# Patient Record
Sex: Female | Born: 1950 | Race: White | Hispanic: No | Smoking: Former smoker
Health system: Southern US, Community
[De-identification: ages and names within clinical notes are randomized; demographics above are authoritative.]

## PROBLEM LIST (undated history)

## (undated) DIAGNOSIS — B9620 Unspecified Escherichia coli [E. coli] as the cause of diseases classified elsewhere: Secondary | ICD-10-CM

## (undated) DIAGNOSIS — R2681 Unsteadiness on feet: Secondary | ICD-10-CM

## (undated) DIAGNOSIS — E785 Hyperlipidemia, unspecified: Secondary | ICD-10-CM

## (undated) DIAGNOSIS — F0390 Unspecified dementia without behavioral disturbance: Secondary | ICD-10-CM

## (undated) DIAGNOSIS — N179 Acute kidney failure, unspecified: Secondary | ICD-10-CM

## (undated) DIAGNOSIS — M6281 Muscle weakness (generalized): Secondary | ICD-10-CM

## (undated) DIAGNOSIS — N390 Urinary tract infection, site not specified: Secondary | ICD-10-CM

## (undated) DIAGNOSIS — I69891 Dysphagia following other cerebrovascular disease: Secondary | ICD-10-CM

## (undated) DIAGNOSIS — I10 Essential (primary) hypertension: Secondary | ICD-10-CM

## (undated) DIAGNOSIS — R1312 Dysphagia, oropharyngeal phase: Secondary | ICD-10-CM

## (undated) DIAGNOSIS — F419 Anxiety disorder, unspecified: Secondary | ICD-10-CM

## (undated) HISTORY — DX: Unspecified dementia, unspecified severity, without behavioral disturbance, psychotic disturbance, mood disturbance, and anxiety: F03.90

## (undated) HISTORY — PX: ABDOMINAL HYSTERECTOMY: SHX81

## (undated) HISTORY — DX: Hyperlipidemia, unspecified: E78.5

## (undated) HISTORY — DX: Essential (primary) hypertension: I10

## (undated) HISTORY — DX: Anxiety disorder, unspecified: F41.9

---

## 2020-11-23 ENCOUNTER — Encounter: Payer: Self-pay | Admitting: Family

## 2020-11-23 ENCOUNTER — Ambulatory Visit (INDEPENDENT_AMBULATORY_CARE_PROVIDER_SITE_OTHER): Payer: PPO | Admitting: Family

## 2020-11-23 ENCOUNTER — Other Ambulatory Visit: Payer: Self-pay

## 2020-11-23 VITALS — BP 118/64 | HR 48 | Temp 96.4°F | Resp 20 | Ht 64.0 in | Wt 107.2 lb

## 2020-11-23 DIAGNOSIS — R636 Underweight: Secondary | ICD-10-CM | POA: Diagnosis not present

## 2020-11-23 DIAGNOSIS — Z87891 Personal history of nicotine dependence: Secondary | ICD-10-CM | POA: Diagnosis not present

## 2020-11-23 DIAGNOSIS — F0281 Dementia in other diseases classified elsewhere with behavioral disturbance: Secondary | ICD-10-CM

## 2020-11-23 DIAGNOSIS — G4701 Insomnia due to medical condition: Secondary | ICD-10-CM

## 2020-11-23 DIAGNOSIS — I10 Essential (primary) hypertension: Secondary | ICD-10-CM | POA: Diagnosis not present

## 2020-11-23 DIAGNOSIS — Z0000 Encounter for general adult medical examination without abnormal findings: Secondary | ICD-10-CM

## 2020-11-23 DIAGNOSIS — R600 Localized edema: Secondary | ICD-10-CM

## 2020-11-23 DIAGNOSIS — R197 Diarrhea, unspecified: Secondary | ICD-10-CM | POA: Diagnosis not present

## 2020-11-23 DIAGNOSIS — Z681 Body mass index (BMI) 19 or less, adult: Secondary | ICD-10-CM

## 2020-11-23 DIAGNOSIS — E782 Mixed hyperlipidemia: Secondary | ICD-10-CM

## 2020-11-23 DIAGNOSIS — F411 Generalized anxiety disorder: Secondary | ICD-10-CM | POA: Diagnosis not present

## 2020-11-23 DIAGNOSIS — G300 Alzheimer's disease with early onset: Secondary | ICD-10-CM

## 2020-11-23 DIAGNOSIS — F02818 Dementia in other diseases classified elsewhere, unspecified severity, with other behavioral disturbance: Secondary | ICD-10-CM

## 2020-11-23 MED ORDER — TRAZODONE HCL 100 MG PO TABS: 100.0000 mg | ORAL_TABLET | Freq: Every day | ORAL | 3 refills | Status: AC

## 2020-11-23 MED ORDER — FUROSEMIDE 40 MG PO TABS: 40.0000 mg | ORAL_TABLET | Freq: Every day | ORAL | 3 refills | Status: AC

## 2020-11-23 MED ORDER — DONEPEZIL HCL 5 MG PO TABS: 5.0000 mg | ORAL_TABLET | Freq: Every day | ORAL | 3 refills | Status: DC

## 2020-11-23 MED ORDER — POTASSIUM CHLORIDE CRYS ER 20 MEQ PO TBCR: 20.0000 meq | EXTENDED_RELEASE_TABLET | Freq: Every day | ORAL | 3 refills | Status: DC

## 2020-11-23 MED ORDER — RISPERIDONE 1 MG PO TBDP
1.0000 mg | ORAL_TABLET | Freq: Two times a day (BID) | ORAL | 3 refills | Status: DC
Start: 2020-11-23 — End: 2021-01-01

## 2020-11-23 MED ORDER — DIVALPROEX SODIUM 250 MG PO DR TAB: 250.0000 mg | DELAYED_RELEASE_TABLET | Freq: Two times a day (BID) | ORAL | 3 refills | Status: AC

## 2020-11-23 MED ORDER — TRAZODONE HCL 50 MG PO TABS: 25.0000 mg | ORAL_TABLET | Freq: Two times a day (BID) | ORAL | 3 refills | Status: DC

## 2020-11-23 MED ORDER — ATENOLOL 25 MG PO TABS: 25.0000 mg | ORAL_TABLET | Freq: Every day | ORAL | 3 refills | Status: AC

## 2020-11-23 MED ORDER — BUSPIRONE HCL 15 MG PO TABS: 15.0000 mg | ORAL_TABLET | Freq: Two times a day (BID) | ORAL | 3 refills | Status: AC

## 2020-11-23 MED ORDER — DIVALPROEX SODIUM 125 MG PO DR TAB: 125.0000 mg | DELAYED_RELEASE_TABLET | Freq: Two times a day (BID) | ORAL | 0 refills | Status: DC

## 2020-11-23 MED ORDER — SIMVASTATIN 40 MG PO TABS: 40.0000 mg | ORAL_TABLET | Freq: Every day | ORAL | 3 refills | Status: DC

## 2020-11-23 MED ORDER — AMLODIPINE BESYLATE 5 MG PO TABS: 5.0000 mg | ORAL_TABLET | Freq: Every day | ORAL | 3 refills | Status: AC

## 2020-11-23 NOTE — Patient Instructions (Addendum)
- please collect stool specimen and return to office   - Decrease Aricept from 10 mg to 5 mg tablet daily x 1 week then discontinue due to diarrhea.  - Labs done today will call you with results    Dementia Dementia is a condition that affects the way the brain functions. It often affects memory and thinking. Usually, dementia gets worse with time and cannot be reversed (progressive dementia). There are many types of dementia, including: Alzheimer's disease. This type is the most common. Vascular dementia. This type may happen as the result of a stroke. Lewy body dementia. This type may happen to people who have Parkinson's disease. Frontotemporal dementia. This type is caused by damage to nerve cells (neurons) in certain parts of the brain. Some people may be affected by more than one type of dementia. This is calledmixed dementia. What are the causes? Dementia is caused by damage to cells in the brain. The area of the brain and the types of cells damaged determine the type of dementia. Usually, this damage is irreversible or cannot be undone. Some examples of irreversible causes include: Conditions that affect the blood vessels of the brain, such as diabetes, heart disease, or blood vessel disease. Genetic mutations. In some cases, changes in the brain may be caused by another condition and can be reversed or slowed. Some examples of reversible causes include: Injury to the brain. Certain medicines. Infection, such as meningitis. Metabolic problems, such as vitamin B12 deficiency or thyroid disease. Pressure on the brain, such as from a tumor, blood clot, or too much fluid in the brain (hydrocephalus). Autoimmune diseases that affect the brain or arteries, such as limbic encephalitis or vasculitis. What are the signs or symptoms? Symptoms of dementia depend on the type of dementia. Common signs of dementia include problems with remembering, thinking, problem solving, decision making, and  communicating. These signs develop slowly or get worse with time. This may include: Problems remembering events or people. Having trouble taking a bath or putting clothes on. Forgetting appointments or forgetting to pay bills. Difficulty planning and preparing meals. Having trouble speaking. Getting lost easily. Changes in behavior or mood. How is this diagnosed? This condition is diagnosed by a specialist (neurologist). It is diagnosed based on the history of your symptoms, your medical history, a physical exam, and tests. Tests may include: Tests to evaluate brain function, such as memory tests, cognitive tests, and other tests. Lab tests, such as blood or urine tests. Imaging tests, such as a CT scan, a PET scan, or an MRI. Genetic testing. This may be done if other family members have a diagnosis of certain types of dementia. Your health care provider will talk with you and your family, friends, orcaregivers about your history and symptoms. How is this treated? Treatment for this condition depends on the cause of the dementia. Progressive dementias, such as Alzheimer's disease, cannot be cured, but there may betreatments that help to manage symptoms. Treatment might involve taking medicines that may help to: Control the dementia. Slow down the progression of the dementia. Manage symptoms. In some cases, treating the cause of your dementia can improve symptoms,reverse symptoms, or slow down how quickly your dementia becomes worse. Your health care provider can direct you to support groups, organizations, andother health care providers who can help with decisions about your care. Follow these instructions at home: Medicines Take over-the-counter and prescription medicines only as told by your health care provider. Use a pill organizer or pill reminder to  help you manage your medicines. Avoid taking medicines that can affect thinking, such as pain medicines or sleeping  medicines. Lifestyle Make healthy lifestyle choices. Be physically active as told by your health care provider. Do not use any products that contain nicotine or tobacco, such as cigarettes, e-cigarettes, and chewing tobacco. If you need help quitting, ask your health care provider. Do not drink alcohol. Practice stress-management techniques when you get stressed. Spend time with other people. Make sure to get quality sleep. These tips can help you get a good night's rest: Avoid napping during the day. Keep your sleeping area dark and cool. Avoid exercising during the few hours before you go to bed. Avoid caffeine products in the evening. Eating and drinking Drink enough fluid to keep your urine pale yellow. Eat a healthy diet. General instructions  Work with your health care provider to determine what you need help with and what your safety needs are. Talk with your health care provider about whether it is safe for you to drive. If you were given a bracelet that identifies you as a person with memory loss or tracks your location, make sure to wear it at all times. Work with your family to make important decisions, such as advance directives, medical power of attorney, or a living will. Keep all follow-up visits. This is important.  Where to find more information Alzheimer's Association: LimitLaws.hu [employer] on Aging: CashCowGambling.be World Health Organization: https://castaneda-walker.com/ Contact a health care provider if: You have any new or worsening symptoms. You have problems with choking or swallowing. Get help right away if: You feel depressed or sad, or feel that you want to harm yourself. Your family members become concerned for your safety. If you ever feel like you may hurt yourself or others, or have thoughts about taking your own life, get help right away. Go to your nearest emergency department or: Call your local emergency services (911 in the [place] Call a  suicide crisis helpline, such as the National Suicide Prevention Lifeline at (231)396-9723. This is open 24 hours a day in the [place] Text the Crisis Text Line at 364 605 2391 (in the [place] Summary Dementia is a condition that affects the way the brain functions. Dementia often affects memory and thinking. Usually, dementia gets worse with time and cannot be reversed (progressive dementia). Treatment for this condition depends on the cause of the dementia. Work with your health care provider to determine what you need help with and what your safety needs are. Your health care provider can direct you to support groups, organizations, and other health care providers who can help with decisions about your care. This information is not intended to replace advice given to you by your health care provider. Make sure you discuss any questions you have with your healthcare provider. Document Revised: 09/19/2019 Document Reviewed: 09/19/2019 Elsevier Patient Education  2022 [employer].

## 2020-11-23 NOTE — Progress Notes (Signed)
Provider: Marlowe Sax FNP-C   No primary care provider on file.  No care team member to display  Extended Emergency Contact Information Primary Emergency Contact: [place] Mobile Phone: [phone] Relation: Daughter Secondary Emergency Contact: [place] Mobile Phone: [phone] Relation: Friend  Code Status:  Full Code  Goals of care: Advanced Directive information Advanced Directives 11/23/2020  Does Patient Have a Medical Advance Directive? Yes  Type of Paramedic of [place];Living will  Does patient want to make changes to medical advance directive? No - Patient declined  Copy of [place] in Chart? No - copy requested     Chief Complaint  Patient presents with   Establish Care    Patient presents to establish care. Patients daughter [name] states patient is having diarrhea daily and a large cognitive decline.     HPI:  Pt is a 70 y.o. female seen today to establish care at [facility] practice for medical management of chronic diseases. She is here with her daughter [name] who provides HPI information.Patient walked in to the office but fell asleep in the exam room not answering any question.Daughter states will be wide awake in the afternoon wondering around.She has a medical history of Hypertension,Hyperlipidemia,Dementia with behavioral disturbance,Generalized anxiety Insomnia, Colitis among others.  Recently relocated from [place] where she lived with her Boyfriend.She wondered at night and was found away from her house by the police.She was placed in an [place] due to declined in condition required assistance with Her ADL's.She was wondered around and required higher level of care so was " kicked out of the facility " due to aggressiveness per daughter's report.  Since relocating  in [place] to [place] she has been living with her only daughter at home.Daughter states has an Architectural technologist at home  who helps with patient's ADL's while she goes to work but the [employer] also has some physical issues cannot take care of her longer.she does attend [facility] during the day.   Daughter states has been having diarrhea daily.No nausea or vomiting.does not really verbalize needs.so not clear if she has any abdominal pain. Whenever she speaks just mumbles words that cannot be understood. Daughter wonders if placing in a memory care is a good idea whether she qualifies for memory care.daughter is tearful talking about placement states not sure whether she is making the right decision for the mom.       Past Medical History:  Diagnosis Date   Anxiety    Dementia ([place])    Hyperlipidemia    Hypertension    Past Surgical History:  Procedure Laterality Date   ABDOMINAL HYSTERECTOMY      No Known Allergies  Allergies as of 11/23/2020   No Known Allergies      Medication List        Accurate as of November 23, 2020  8:44 AM. If you have any questions, ask your nurse or doctor.          ABC Complete Senior Womens 50+ Tabs Take 1 tablet by mouth daily.   amLODipine 5 MG tablet Commonly known as: NORVASC Take 5 mg by mouth daily.   atenolol 25 MG tablet Commonly known as: TENORMIN Take 25 mg by mouth at bedtime.   busPIRone 15 MG tablet Commonly known as: BUSPAR Take 15 mg by mouth 2 (two) times daily.   divalproex 250 MG DR tablet Commonly known as: DEPAKOTE Take 250 mg by mouth 2 (two) times  daily.   divalproex 125 MG DR tablet Commonly known as: DEPAKOTE Take 125 mg by mouth 2 (two) times daily.   donepezil 10 MG tablet Commonly known as: ARICEPT Take 10 mg by mouth at bedtime.   furosemide 40 MG tablet Commonly known as: LASIX Take 40 mg by mouth daily.   potassium chloride SA 20 MEQ tablet Commonly known as: KLOR-CON Take 20 mEq by mouth daily.   risperiDONE 1 MG disintegrating tablet Commonly known as: RISPERDAL M-TABS Take 1 mg by mouth  2 (two) times daily.   simvastatin 40 MG tablet Commonly known as: ZOCOR Take 40 mg by mouth daily.   traZODone 50 MG tablet Commonly known as: DESYREL Take 25 mg by mouth 2 (two) times daily.   traZODone 100 MG tablet Commonly known as: DESYREL Take 100 mg by mouth at bedtime.        Review of Systems  Unable to perform ROS: Dementia (HPI provided by patient's Daughter)  Constitutional:  Negative for appetite change, chills, fatigue, fever and unexpected weight change.  HENT:  Negative for congestion, dental problem, ear discharge, ear pain, facial swelling, hearing loss, nosebleeds, postnasal drip, rhinorrhea, sinus pressure, sinus pain, sneezing, sore throat, tinnitus and trouble swallowing.   Eyes:  Negative for pain, discharge, redness, itching and visual disturbance.  Respiratory:  Negative for cough, chest tightness, shortness of breath and wheezing.   Cardiovascular:  Negative for chest pain, palpitations and leg swelling.  Gastrointestinal:  Negative for abdominal distention, abdominal pain, blood in stool, constipation, diarrhea, nausea and vomiting.  Endocrine: Negative for cold intolerance, heat intolerance, polydipsia, polyphagia and polyuria.  Genitourinary:  Negative for difficulty urinating, dysuria, flank pain, frequency and urgency.  Musculoskeletal:  Negative for arthralgias, back pain, gait problem, joint swelling, myalgias, neck pain and neck stiffness.  Skin:  Negative for color change, pallor, rash and wound.  Neurological:  Negative for dizziness, syncope, speech difficulty, weakness, light-headedness, numbness and headaches.  Hematological:  Does not bruise/bleed easily.  Psychiatric/Behavioral:  Positive for confusion. Negative for hallucinations and sleep disturbance. The patient is not nervous/anxious.        Reports wondering worst in the afternoon    Immunization History  Administered Date(s) Administered   Moderna Sars-Covid-2 Vaccination  12/23/2019, 01/20/2020, 09/28/2020   Pertinent  Health Maintenance Due  Topic Date Due   COLONOSCOPY (Pts 45-34yrs Insurance coverage will need to be confirmed)  Never done   MAMMOGRAM  Never done   DEXA SCAN  Never done   PNA vac Low Risk Adult (1 of 2 - PCV13) Never done   INFLUENZA VACCINE  12/17/2020   Fall Risk  11/23/2020  Falls in the past year? 1  Number falls in past yr: 1  Injury with Fall? 0  Risk for fall due to : History of fall(s)  Follow up Falls evaluation completed   Functional Status Survey:    Vitals:   11/23/20 0839  BP: 118/64  Pulse: (!) 48  Resp: 20  Temp: (!) 96.4 F (35.8 C)  SpO2: 93%  Weight: 107 lb 3.2 oz (48.6 kg)  Height: $Remove'5\' 4"'tBVlrEO$  (1.626 m)   Body mass index is 18.4 kg/m. Physical Exam Vitals reviewed.  Constitutional:      General: She is sleeping. She is not in acute distress.    Appearance: Normal appearance. She is underweight. She is not ill-appearing or diaphoretic.  HENT:     Head: Normocephalic.     Right Ear: Tympanic membrane, ear canal and  external ear normal. There is no impacted cerumen.     Left Ear: Tympanic membrane, ear canal and external ear normal. There is no impacted cerumen.     Nose: Nose normal. No congestion or rhinorrhea.     Mouth/Throat:     Comments: Unable to assess had mouth closed  Eyes:     General: No scleral icterus.       Right eye: No discharge.        Left eye: No discharge.     Conjunctiva/sclera: Conjunctivae normal.     Pupils: Pupils are equal, round, and reactive to light.  Cardiovascular:     Rate and Rhythm: Normal rate and regular rhythm.     Pulses: Normal pulses.     Heart sounds: Normal heart sounds. No murmur heard.   No friction rub. No gallop.  Pulmonary:     Effort: Pulmonary effort is normal. No respiratory distress.     Breath sounds: Normal breath sounds. No wheezing, rhonchi or rales.  Chest:     Chest wall: No tenderness.  Abdominal:     General: Bowel sounds are increased.  There is no distension.     Palpations: Abdomen is soft. There is no mass.     Tenderness: There is no abdominal tenderness. There is no right CVA tenderness, left CVA tenderness, guarding or rebound.     Comments: Bowel sounds hyperactive x 4 Quadrant   Musculoskeletal:        General: No swelling or tenderness. Normal range of motion.     Cervical back: Normal range of motion. No rigidity or tenderness.     Right lower leg: No edema.     Left lower leg: No edema.  Lymphadenopathy:     Cervical: No cervical adenopathy.  Skin:    General: Skin is warm and dry.     Coloration: Skin is not pale.     Findings: No bruising, erythema, lesion or rash.  Neurological:     Cranial Nerves: No cranial nerve deficit.     Sensory: No sensory deficit.     Motor: No weakness.     Coordination: Coordination normal.     Gait: Gait normal.     Comments: Unable to complete MMSE due to sleeping throughout the visit  Psychiatric:        Mood and Affect: Mood normal.        Behavior: Behavior is uncooperative.        Cognition and Memory: Cognition is impaired. Memory is impaired.     Comments: Non-verbal and sleeping throughout the visit Unable to complete MMSE      Labs reviewed: No results for input(s): NA, K, CL, CO2, GLUCOSE, BUN, CREATININE, CALCIUM, MG, PHOS in the last 8760 hours. No results for input(s): AST, ALT, ALKPHOS, BILITOT, PROT, ALBUMIN in the last 8760 hours. No results for input(s): WBC, NEUTROABS, HGB, HCT, MCV, PLT in the last 8760 hours. No results found for: TSH No results found for: HGBA1C No results found for: CHOL, HDL, LDLCALC, LDLDIRECT, TRIG, CHOLHDL  Significant Diagnostic Results in last 30 days:  No results found.  Assessment/Plan 1. Essential hypertension B/p well controlled. - continue on Amlodipine 5 mg daily, furosemide 40 mg tablet daily and atenolol 25 mg tablet daily at bedtime.  - CBC with Differential/Platelet - CMP with eGFR([facility]) - TSH -  amLODipine (NORVASC) 5 MG tablet; Take 1 tablet (5 mg total) by mouth daily.  Dispense: 30 tablet; Refill: 3 - atenolol (TENORMIN)  25 MG tablet; Take 1 tablet (25 mg total) by mouth at bedtime.  Dispense: 30 tablet; Refill: 3  2. Mixed hyperlipidemia No labs for review. Continue on simvastatin 40 mg tablet daily  - Lipid panel - simvastatin (ZOCOR) 40 MG tablet; Take 1 tablet (40 mg total) by mouth daily.  Dispense: 30 tablet; Refill: 3  3. Encounter for medical examination to establish care Relocated from [place].No medical records for review.will need to obtain medical records then update immunization and required screening. Has had 3rd COVID-19 vaccine due for 4 th booster daughter will arrange take her to pharmacy.   4. 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 Resides with her daughter and Aunt who assist with her ADL's. Transfer to higher level of Care in Memory Care Unit recommended due to wandering and increased need of care.daughter will update provider then write [place] 2 once she identifies a memory Care Unit they desire.  - polypharmacy noted though daughter requested not to change Divalproex ,Risperidone and trazodone dosage since it's working well for patient's behaviors and aggressiveness.  Recommended weaning medication to lower dosage.  Advised to reduce Aricept to 5 mg tablet daily x 1 week then discontinue due to weight loss and diarrhea.Aricept probably not beneficial for her at this stage of dementia.  - Vitamin B12 - donepezil (ARICEPT) 5 MG tablet; Take 1 tablet (5 mg total) by mouth at bedtime.  Dispense: 30 tablet; Refill: 3 - divalproex (DEPAKOTE) 125 MG DR tablet; Take 1 tablet (125 mg total) by mouth 2 (two) times daily.  Dispense: 30 tablet; Refill: 0 - divalproex (DEPAKOTE) 250 MG DR tablet;  Take 1 tablet (250 mg total) by mouth 2 (two) times daily.  Dispense: 30 tablet; Refill: 3 - risperiDONE (RISPERDAL M-TABS) 1 MG disintegrating tablet; Take 1 tablet (1 mg total) by mouth 2 (two) times daily.  Dispense: 30 tablet; Refill: 3 - additional education information provided on AVS and Dementia Booklet copy also given.  5. Insomnia due to medical condition Continue on trazodone.recommended dose reduction but daughter declined patient sleeping well on current dose.  - traZODone (DESYREL) 100 MG tablet; Take 1 tablet (100 mg total) by mouth at bedtime.  Dispense: 30 tablet; Refill: 3 - traZODone (DESYREL) 50 MG tablet; Take 0.5 tablets (25 mg total) by mouth 2 (two) times daily.  Dispense: 30 tablet; Refill: 3  6. Generalized anxiety disorder Continue on Buspar  - busPIRone (BUSPAR) 15 MG tablet; Take 1 tablet (15 mg total) by mouth 2 (two) times daily.  Dispense: 30 tablet; Refill: 3  7. Diarrhea, unspecified type Suspect possible due to SE from Aricept.will discontinue as above Obtain stool specimen at home then bring to office lab to rule out ova and Cyst and C-diff.  - Aerobic Culture; Future  8. Bilateral edema of lower extremity Continue on Furosemide.No edema noted.will obtain medical records to evaluate for hx of CHF.Reduce dose to 20 mg tablet next visit if no edema or signs of fluid overload.  - furosemide (LASIX) 40 MG tablet; Take 1 tablet (40 mg total) by mouth daily.  Dispense: 30 tablet; Refill: 3 - potassium chloride SA (KLOR-CON) 20 MEQ tablet; Take 1 tablet (20 mEq total) by mouth daily.  Dispense: 30 tablet; Refill: 3  9. Former smoker Smoked for many years per daughter.   Family/ staff Communication: Reviewed plan of care with  patient and daughter.Patient slept throughout the visit alert but nonverbal.   Labs/tests ordered:  - CBC with Differential/Platelet - CMP with eGFR([facility]) - TSH - Lipid panel - Vitamin B 12 - Aerobic Culture; Future  Next  Appointment : 5 months for medical management of chronic issues.   Sandrea Hughs, NP

## 2020-11-24 LAB — CBC WITH DIFFERENTIAL/PLATELET
Absolute Monocytes: 780 cells/uL (ref 200–950)
Basophils Absolute: 18 cells/uL (ref 0–200)
Basophils Relative: 0.3 %
Eosinophils Absolute: 30 cells/uL (ref 15–500)
Eosinophils Relative: 0.5 %
HCT: 29.9 % — ABNORMAL LOW (ref 35.0–45.0)
Hemoglobin: 10.3 g/dL — ABNORMAL LOW (ref 11.7–15.5)
Lymphs Abs: 1176 cells/uL (ref 850–3900)
MCH: 33.4 pg — ABNORMAL HIGH (ref 27.0–33.0)
MCHC: 34.4 g/dL (ref 32.0–36.0)
MCV: 97.1 fL (ref 80.0–100.0)
MPV: 11.1 fL (ref 7.5–12.5)
Monocytes Relative: 13 %
Neutro Abs: 3996 cells/uL (ref 1500–7800)
Neutrophils Relative %: 66.6 %
Platelets: 178 10*3/uL (ref 140–400)
RBC: 3.08 10*6/uL — ABNORMAL LOW (ref 3.80–5.10)
RDW: 12.9 % (ref 11.0–15.0)
Total Lymphocyte: 19.6 %
WBC: 6 10*3/uL (ref 3.8–10.8)

## 2020-11-24 LAB — COMPLETE METABOLIC PANEL WITH GFR
AG Ratio: 1.7 (calc) (ref 1.0–2.5)
ALT: 11 U/L (ref 6–29)
AST: 16 U/L (ref 10–35)
Albumin: 3.9 g/dL (ref 3.6–5.1)
Alkaline phosphatase (APISO): 38 U/L (ref 37–153)
BUN: 22 mg/dL (ref 7–25)
CO2: 30 mmol/L (ref 20–32)
Calcium: 9 mg/dL (ref 8.6–10.4)
Chloride: 102 mmol/L (ref 98–110)
Creat: 0.83 mg/dL (ref 0.60–0.93)
GFR, Est African American: 83 mL/min/{1.73_m2} (ref >=60)
GFR, Est Non African American: 71 mL/min/{1.73_m2} (ref >=60)
Globulin: 2.3 g/dL (calc) (ref 1.9–3.7)
Glucose, Bld: 78 mg/dL (ref 65–99)
Potassium: 3.8 mmol/L (ref 3.5–5.3)
Sodium: 142 mmol/L (ref 135–146)
Total Bilirubin: 0.5 mg/dL (ref 0.2–1.2)
Total Protein: 6.2 g/dL (ref 6.1–8.1)

## 2020-11-24 LAB — LIPID PANEL
Cholesterol: 158 mg/dL (ref <200)
HDL: 56 mg/dL (ref >=50)
LDL Cholesterol (Calc): 83 mg/dL (calc)
Non-HDL Cholesterol (Calc): 102 mg/dL (calc) (ref <130)
Total CHOL/HDL Ratio: 2.8 (calc) (ref <5.0)
Triglycerides: 97 mg/dL (ref <150)

## 2020-11-24 LAB — VITAMIN B12: Vitamin B-12: 589 pg/mL (ref 200–1100)

## 2020-11-24 LAB — TSH: TSH: 2.24 mIU/L (ref 0.40–4.50)

## 2020-11-25 DIAGNOSIS — E782 Mixed hyperlipidemia: Secondary | ICD-10-CM | POA: Insufficient documentation

## 2020-11-25 DIAGNOSIS — I10 Essential (primary) hypertension: Secondary | ICD-10-CM | POA: Insufficient documentation

## 2020-11-25 DIAGNOSIS — R636 Underweight: Secondary | ICD-10-CM | POA: Insufficient documentation

## 2020-11-25 DIAGNOSIS — Z681 Body mass index (BMI) 19 or less, adult: Secondary | ICD-10-CM | POA: Insufficient documentation

## 2020-11-25 DIAGNOSIS — Z87891 Personal history of nicotine dependence: Secondary | ICD-10-CM | POA: Insufficient documentation

## 2020-11-25 DIAGNOSIS — G4701 Insomnia due to medical condition: Secondary | ICD-10-CM | POA: Insufficient documentation

## 2020-11-25 DIAGNOSIS — G300 Alzheimer's disease with early onset: Secondary | ICD-10-CM | POA: Insufficient documentation

## 2020-11-25 DIAGNOSIS — R600 Localized edema: Secondary | ICD-10-CM | POA: Insufficient documentation

## 2020-11-25 DIAGNOSIS — F411 Generalized anxiety disorder: Secondary | ICD-10-CM | POA: Insufficient documentation

## 2020-11-27 ENCOUNTER — Other Ambulatory Visit: Payer: Self-pay

## 2020-11-27 DIAGNOSIS — R197 Diarrhea, unspecified: Secondary | ICD-10-CM

## 2020-12-03 DIAGNOSIS — R197 Diarrhea, unspecified: Secondary | ICD-10-CM | POA: Diagnosis not present

## 2020-12-05 LAB — SALMONELLA/SHIGELLA CULT, CAMPY EIA AND SHIGA TOXIN RFL ECOLI
MICRO NUMBER: 12132307
MICRO NUMBER:: 12132308
MICRO NUMBER:: 12132309
Result:: NOT DETECTED
SHIGA RESULT:: NOT DETECTED
SPECIMEN QUALITY: ADEQUATE
SPECIMEN QUALITY:: ADEQUATE
SPECIMEN QUALITY:: ADEQUATE

## 2020-12-13 ENCOUNTER — Telehealth: Payer: Self-pay | Admitting: Family

## 2020-12-13 DIAGNOSIS — R197 Diarrhea, unspecified: Secondary | ICD-10-CM

## 2020-12-13 NOTE — Telephone Encounter (Signed)
Pts daughter stopped in to pick up ltr & has decided that moving forward with referral to Gastrology would be a good idea.  Once referral is made, I will send  Thanks, Rosezella [place]

## 2020-12-13 NOTE — Telephone Encounter (Signed)
Gastroenterology referral ordered.  

## 2020-12-14 ENCOUNTER — Other Ambulatory Visit: Payer: Self-pay | Admitting: Family

## 2020-12-14 NOTE — Telephone Encounter (Signed)
Medication requested for patient was for Aricept 10 mg and according to active medication list patient is taking 5 mg daily and is not due for refills at this time

## 2020-12-19 ENCOUNTER — Encounter: Payer: Self-pay | Admitting: Gastroenterology

## 2020-12-19 ENCOUNTER — Other Ambulatory Visit: Payer: Self-pay | Admitting: Family

## 2020-12-19 DIAGNOSIS — G300 Alzheimer's disease with early onset: Secondary | ICD-10-CM

## 2020-12-19 MED ORDER — MEMANTINE HCL ER 7 & 14 & 21 &28 MG PO CP24
ORAL_CAPSULE | ORAL | 3 refills | Status: DC
Start: 2020-12-19 — End: 2020-12-21

## 2020-12-20 ENCOUNTER — Telehealth: Payer: Self-pay | Admitting: *Deleted

## 2020-12-20 NOTE — Telephone Encounter (Signed)
Received a fax from [facility] stating that they cannot get Titration Pack for Namenda XR and needs an alternative.   Please Advise.

## 2020-12-21 MED ORDER — MEMANTINE HCL ER 28 MG PO CP24: 28.0000 mg | ORAL_CAPSULE | Freq: Every day | ORAL | 3 refills | Status: DC

## 2020-12-21 MED ORDER — MEMANTINE HCL ER 28 MG PO CP24: 28.0000 mg | ORAL_CAPSULE | Freq: Every day | ORAL | 3 refills | Status: AC

## 2020-12-21 NOTE — Addendum Note (Signed)
Addended by: Nelda Severe A on: 12/21/2020 03:02 PM   Modules accepted: Orders

## 2020-12-21 NOTE — Telephone Encounter (Signed)
Called and spoke with Chelsea Aguirre at [facility] and she stated that they ONLY have Namenda XR 28mg  capsules  They have no recommendation for alternative.   Please Advise.

## 2020-12-21 NOTE — Telephone Encounter (Signed)
Medication list updated and Rx sent to pharmacy  

## 2020-12-21 NOTE — Telephone Encounter (Signed)
Discontinue Namenda titration pack  Start on Namenda XR 28 mg capsule one by mouth daily.

## 2020-12-21 NOTE — Telephone Encounter (Signed)
Please inquire which Alternative that pharmacy carries then replace.

## 2020-12-27 ENCOUNTER — Telehealth: Payer: Self-pay | Admitting: *Deleted

## 2020-12-27 ENCOUNTER — Inpatient Hospital Stay (HOSPITAL_COMMUNITY)
Admission: EM | Admit: 2020-12-27 | Discharge: 2021-01-01 | DRG: 689 | Disposition: A | Discharge status: Skilled Nursing Facility | Payer: PPO | Attending: Internal Medicine | Admitting: Internal Medicine

## 2020-12-27 ENCOUNTER — Other Ambulatory Visit: Payer: Self-pay

## 2020-12-27 ENCOUNTER — Encounter (HOSPITAL_COMMUNITY): Payer: Self-pay | Admitting: Internal Medicine

## 2020-12-27 ENCOUNTER — Emergency Department (HOSPITAL_COMMUNITY): Payer: PPO

## 2020-12-27 DIAGNOSIS — E782 Mixed hyperlipidemia: Secondary | ICD-10-CM | POA: Diagnosis not present

## 2020-12-27 DIAGNOSIS — D649 Anemia, unspecified: Secondary | ICD-10-CM | POA: Diagnosis not present

## 2020-12-27 DIAGNOSIS — F32A Depression, unspecified: Secondary | ICD-10-CM | POA: Diagnosis present

## 2020-12-27 DIAGNOSIS — E43 Unspecified severe protein-calorie malnutrition: Secondary | ICD-10-CM | POA: Insufficient documentation

## 2020-12-27 DIAGNOSIS — E860 Dehydration: Secondary | ICD-10-CM | POA: Diagnosis present

## 2020-12-27 DIAGNOSIS — Z515 Encounter for palliative care: Secondary | ICD-10-CM | POA: Diagnosis not present

## 2020-12-27 DIAGNOSIS — F419 Anxiety disorder, unspecified: Secondary | ICD-10-CM | POA: Diagnosis not present

## 2020-12-27 DIAGNOSIS — F0281 Dementia in other diseases classified elsewhere with behavioral disturbance: Secondary | ICD-10-CM | POA: Diagnosis present

## 2020-12-27 DIAGNOSIS — Z9183 Wandering in diseases classified elsewhere: Secondary | ICD-10-CM

## 2020-12-27 DIAGNOSIS — R2689 Other abnormalities of gait and mobility: Secondary | ICD-10-CM | POA: Diagnosis not present

## 2020-12-27 DIAGNOSIS — Z87891 Personal history of nicotine dependence: Secondary | ICD-10-CM | POA: Diagnosis not present

## 2020-12-27 DIAGNOSIS — F411 Generalized anxiety disorder: Secondary | ICD-10-CM | POA: Diagnosis not present

## 2020-12-27 DIAGNOSIS — F0390 Unspecified dementia without behavioral disturbance: Secondary | ICD-10-CM | POA: Diagnosis not present

## 2020-12-27 DIAGNOSIS — G300 Alzheimer's disease with early onset: Secondary | ICD-10-CM | POA: Diagnosis present

## 2020-12-27 DIAGNOSIS — Z681 Body mass index (BMI) 19 or less, adult: Secondary | ICD-10-CM | POA: Diagnosis not present

## 2020-12-27 DIAGNOSIS — Z66 Do not resuscitate: Secondary | ICD-10-CM | POA: Diagnosis present

## 2020-12-27 DIAGNOSIS — I69891 Dysphagia following other cerebrovascular disease: Secondary | ICD-10-CM | POA: Diagnosis not present

## 2020-12-27 DIAGNOSIS — R059 Cough, unspecified: Secondary | ICD-10-CM | POA: Diagnosis not present

## 2020-12-27 DIAGNOSIS — Z20822 Contact with and (suspected) exposure to covid-19: Secondary | ICD-10-CM | POA: Diagnosis present

## 2020-12-27 DIAGNOSIS — R1312 Dysphagia, oropharyngeal phase: Secondary | ICD-10-CM | POA: Diagnosis not present

## 2020-12-27 DIAGNOSIS — N390 Urinary tract infection, site not specified: Principal | ICD-10-CM | POA: Diagnosis present

## 2020-12-27 DIAGNOSIS — R29898 Other symptoms and signs involving the musculoskeletal system: Secondary | ICD-10-CM | POA: Diagnosis not present

## 2020-12-27 DIAGNOSIS — I10 Essential (primary) hypertension: Secondary | ICD-10-CM | POA: Diagnosis present

## 2020-12-27 DIAGNOSIS — N3000 Acute cystitis without hematuria: Secondary | ICD-10-CM | POA: Diagnosis not present

## 2020-12-27 DIAGNOSIS — Z7189 Other specified counseling: Secondary | ICD-10-CM | POA: Diagnosis not present

## 2020-12-27 DIAGNOSIS — Z7401 Bed confinement status: Secondary | ICD-10-CM | POA: Diagnosis not present

## 2020-12-27 DIAGNOSIS — N179 Acute kidney failure, unspecified: Secondary | ICD-10-CM | POA: Diagnosis not present

## 2020-12-27 DIAGNOSIS — Z79899 Other long term (current) drug therapy: Secondary | ICD-10-CM

## 2020-12-27 DIAGNOSIS — R531 Weakness: Secondary | ICD-10-CM | POA: Diagnosis not present

## 2020-12-27 DIAGNOSIS — R410 Disorientation, unspecified: Secondary | ICD-10-CM | POA: Diagnosis not present

## 2020-12-27 DIAGNOSIS — Z820 Family history of epilepsy and other diseases of the nervous system: Secondary | ICD-10-CM | POA: Diagnosis not present

## 2020-12-27 DIAGNOSIS — R4182 Altered mental status, unspecified: Secondary | ICD-10-CM | POA: Diagnosis not present

## 2020-12-27 DIAGNOSIS — B9620 Unspecified Escherichia coli [E. coli] as the cause of diseases classified elsewhere: Secondary | ICD-10-CM | POA: Diagnosis not present

## 2020-12-27 DIAGNOSIS — M6281 Muscle weakness (generalized): Secondary | ICD-10-CM | POA: Diagnosis not present

## 2020-12-27 DIAGNOSIS — Z888 Allergy status to other drugs, medicaments and biological substances status: Secondary | ICD-10-CM | POA: Diagnosis not present

## 2020-12-27 DIAGNOSIS — I6782 Cerebral ischemia: Secondary | ICD-10-CM | POA: Diagnosis not present

## 2020-12-27 DIAGNOSIS — R0902 Hypoxemia: Secondary | ICD-10-CM | POA: Diagnosis not present

## 2020-12-27 DIAGNOSIS — I69828 Other speech and language deficits following other cerebrovascular disease: Secondary | ICD-10-CM | POA: Diagnosis not present

## 2020-12-27 DIAGNOSIS — K529 Noninfective gastroenteritis and colitis, unspecified: Secondary | ICD-10-CM | POA: Diagnosis not present

## 2020-12-27 DIAGNOSIS — R2681 Unsteadiness on feet: Secondary | ICD-10-CM | POA: Diagnosis not present

## 2020-12-27 DIAGNOSIS — D539 Nutritional anemia, unspecified: Secondary | ICD-10-CM | POA: Diagnosis present

## 2020-12-27 DIAGNOSIS — R488 Other symbolic dysfunctions: Secondary | ICD-10-CM | POA: Diagnosis not present

## 2020-12-27 DIAGNOSIS — R404 Transient alteration of awareness: Secondary | ICD-10-CM | POA: Diagnosis not present

## 2020-12-27 DIAGNOSIS — R5381 Other malaise: Secondary | ICD-10-CM | POA: Diagnosis not present

## 2020-12-27 DIAGNOSIS — R9431 Abnormal electrocardiogram [ECG] [EKG]: Secondary | ICD-10-CM | POA: Diagnosis not present

## 2020-12-27 LAB — URINALYSIS, ROUTINE W REFLEX MICROSCOPIC
Bilirubin Urine: NEGATIVE
Glucose, UA: NEGATIVE mg/dL
Hgb urine dipstick: NEGATIVE
Ketones, ur: 5 mg/dL — AB
Nitrite: NEGATIVE
Protein, ur: NEGATIVE mg/dL
Specific Gravity, Urine: 1.008 (ref 1.005–1.030)
WBC, UA: >50 WBC/hpf — ABNORMAL HIGH (ref 0–5)
pH: 6 (ref 5.0–8.0)

## 2020-12-27 LAB — LACTIC ACID, PLASMA
Lactic Acid, Venous: 1.1 mmol/L (ref 0.5–1.9)
Lactic Acid, Venous: 2 mmol/L (ref 0.5–1.9)

## 2020-12-27 LAB — CBC
HCT: 36 % (ref 36.0–46.0)
HCT: 32.5 % — ABNORMAL LOW (ref 36.0–46.0)
Hemoglobin: 11.9 g/dL — ABNORMAL LOW (ref 12.0–15.0)
Hemoglobin: 10.5 g/dL — ABNORMAL LOW (ref 12.0–15.0)
MCH: 33.4 pg (ref 26.0–34.0)
MCH: 33.2 pg (ref 26.0–34.0)
MCHC: 33.1 g/dL (ref 30.0–36.0)
MCHC: 32.3 g/dL (ref 30.0–36.0)
MCV: 101.1 fL — ABNORMAL HIGH (ref 80.0–100.0)
MCV: 102.8 fL — ABNORMAL HIGH (ref 80.0–100.0)
Platelets: 186 10*3/uL (ref 150–400)
Platelets: 171 K/uL (ref 150–400)
RBC: 3.56 MIL/uL — ABNORMAL LOW (ref 3.87–5.11)
RBC: 3.16 MIL/uL — ABNORMAL LOW (ref 3.87–5.11)
RDW: 13.4 % (ref 11.5–15.5)
RDW: 13.5 % (ref 11.5–15.5)
WBC: 8 10*3/uL (ref 4.0–10.5)
WBC: 7.3 K/uL (ref 4.0–10.5)
nRBC: 0 % (ref 0.0–0.2)
nRBC: 0 % (ref 0.0–0.2)

## 2020-12-27 LAB — BASIC METABOLIC PANEL
Anion gap: 10 (ref 5–15)
BUN: 23 mg/dL (ref 8–23)
CO2: 31 mmol/L (ref 22–32)
Calcium: 9.1 mg/dL (ref 8.9–10.3)
Chloride: 102 mmol/L (ref 98–111)
Creatinine, Ser: 1.02 mg/dL — ABNORMAL HIGH (ref 0.44–1.00)
GFR, Estimated: 59 mL/min — ABNORMAL LOW (ref >60)
Glucose, Bld: 107 mg/dL — ABNORMAL HIGH (ref 70–99)
Potassium: 4.1 mmol/L (ref 3.5–5.1)
Sodium: 143 mmol/L (ref 135–145)

## 2020-12-27 LAB — HIV ANTIBODY (ROUTINE TESTING W REFLEX): HIV Screen 4th Generation wRfx: NONREACTIVE

## 2020-12-27 LAB — VITAMIN B12: Vitamin B-12: 498 pg/mL (ref 180–914)

## 2020-12-27 LAB — MAGNESIUM: Magnesium: 2.2 mg/dL (ref 1.7–2.4)

## 2020-12-27 LAB — CREATININE, SERUM
Creatinine, Ser: 0.89 mg/dL (ref 0.44–1.00)
GFR, Estimated: >60 mL/min (ref >60)

## 2020-12-27 LAB — CBG MONITORING, ED: Glucose-Capillary: 108 mg/dL — ABNORMAL HIGH (ref 70–99)

## 2020-12-27 LAB — TSH
TSH: 2.301 u[IU]/mL (ref 0.350–4.500)
TSH: 1.983 u[IU]/mL (ref 0.350–4.500)

## 2020-12-27 LAB — PHOSPHORUS: Phosphorus: 4.2 mg/dL (ref 2.5–4.6)

## 2020-12-27 LAB — FOLATE: Folate: 26.9 ng/mL (ref >5.9)

## 2020-12-27 MED ORDER — ONDANSETRON HCL 4 MG/2ML IJ SOLN: 4.0000 mg | Freq: Four times a day (QID) | INTRAMUSCULAR | Status: DC | PRN

## 2020-12-27 MED ORDER — ONDANSETRON HCL 4 MG PO TABS: 4.0000 mg | ORAL_TABLET | Freq: Four times a day (QID) | ORAL | Status: DC | PRN

## 2020-12-27 MED ORDER — SODIUM CHLORIDE 0.9 % IV SOLN
1.0000 g | Freq: Every day | INTRAVENOUS | Status: DC
  Administered 2020-12-28 – 2020-12-30 (×3): 1 g via INTRAVENOUS
  Filled 2020-12-27 (×3): qty 1

## 2020-12-27 MED ORDER — SODIUM CHLORIDE 0.9 % IV SOLN: 1.0000 g | Freq: Once | INTRAVENOUS | Status: DC

## 2020-12-27 MED ORDER — DIVALPROEX SODIUM 125 MG PO DR TAB
125.0000 mg | DELAYED_RELEASE_TABLET | Freq: Two times a day (BID) | ORAL | Status: DC
  Administered 2020-12-28 – 2021-01-01 (×10): 125 mg via ORAL
  Filled 2020-12-27 (×11): qty 1

## 2020-12-27 MED ORDER — SODIUM CHLORIDE 0.9 % IV BOLUS
1000.0000 mL | Freq: Once | INTRAVENOUS | Status: AC
  Administered 2020-12-27: 1000 mL via INTRAVENOUS

## 2020-12-27 MED ORDER — RISPERIDONE 1 MG PO TABS
1.0000 mg | ORAL_TABLET | Freq: Every day | ORAL | Status: DC
  Administered 2020-12-27 – 2021-01-01 (×6): 1 mg via ORAL
  Filled 2020-12-27 (×6): qty 1

## 2020-12-27 MED ORDER — MEMANTINE HCL ER 28 MG PO CP24
28.0000 mg | ORAL_CAPSULE | Freq: Every day | ORAL | Status: DC
  Administered 2020-12-27 – 2021-01-01 (×6): 28 mg via ORAL
  Filled 2020-12-27 (×8): qty 1

## 2020-12-27 MED ORDER — SIMVASTATIN 20 MG PO TABS
40.0000 mg | ORAL_TABLET | Freq: Every day | ORAL | Status: DC
  Administered 2020-12-27 – 2020-12-28 (×2): 40 mg via ORAL
  Filled 2020-12-27 (×2): qty 2

## 2020-12-27 MED ORDER — ENOXAPARIN SODIUM 40 MG/0.4ML IJ SOSY
40.0000 mg | PREFILLED_SYRINGE | INTRAMUSCULAR | Status: DC
  Administered 2020-12-27 – 2020-12-28 (×2): 40 mg via SUBCUTANEOUS
  Filled 2020-12-27 (×2): qty 0.4

## 2020-12-27 MED ORDER — SODIUM CHLORIDE 0.9 % IV SOLN: INTRAVENOUS | Status: DC

## 2020-12-27 MED ORDER — ACETAMINOPHEN 325 MG PO TABS: 650.0000 mg | ORAL_TABLET | Freq: Four times a day (QID) | ORAL | Status: DC | PRN

## 2020-12-27 MED ORDER — TRAZODONE HCL 100 MG PO TABS
100.0000 mg | ORAL_TABLET | Freq: Every day | ORAL | Status: DC
  Administered 2020-12-28 – 2021-01-01 (×5): 100 mg via ORAL
  Filled 2020-12-27 (×5): qty 1

## 2020-12-27 MED ORDER — SODIUM CHLORIDE 0.9 % IV SOLN
1.0000 g | Freq: Once | INTRAVENOUS | Status: AC
  Administered 2020-12-27: 1 g via INTRAVENOUS
  Filled 2020-12-27: qty 10

## 2020-12-27 MED ORDER — BUSPIRONE HCL 5 MG PO TABS
15.0000 mg | ORAL_TABLET | Freq: Two times a day (BID) | ORAL | Status: DC
  Administered 2020-12-28 – 2021-01-01 (×10): 15 mg via ORAL
  Filled 2020-12-27 (×10): qty 3

## 2020-12-27 MED ORDER — DIVALPROEX SODIUM 250 MG PO DR TAB
250.0000 mg | DELAYED_RELEASE_TABLET | Freq: Two times a day (BID) | ORAL | Status: DC
  Administered 2020-12-28 – 2021-01-01 (×10): 250 mg via ORAL
  Filled 2020-12-27 (×10): qty 1

## 2020-12-27 MED ORDER — ACETAMINOPHEN 650 MG RE SUPP: 650.0000 mg | Freq: Four times a day (QID) | RECTAL | Status: DC | PRN

## 2020-12-27 NOTE — Telephone Encounter (Signed)
Patient daughter, Jake Samples, called and stated that patient has been lethargic since yesterday.  Stated that patient has not gotten out of bed at all today and has not ate or drink. Patient not walking because too weak. Cannot get patient up.   Advised daughter to call 911 to have patient evaluated. Agreed.

## 2020-12-27 NOTE — ED Provider Notes (Signed)
[facility]-EMERGENCY DEPT Provider Note   CSN: [id number] Arrival date & time: 12/27/20  1315     History Chief Complaint  Patient presents with   Weakness    Del Wiseman is a 70 y.o. female with PMH dementia, HLD, HTN who presents the emergency department for evaluation of weakness and mental status decompensation.  Daughter states that at baseline, the patient is nonverbal and pleasantly demented, but able to ambulate without difficulty.  The patient currently is at a daytime skilled nursing memory care unit but returns home in the evenings.  Daughter states that over the last 4 days, the patient has had a significant rapid decline in baseline activity and is currently no longer ambulatory.  She states that the patient has gotten significantly more lethargic.  Denies fever, vomiting, diarrhea or other systemic symptoms.  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     Family History  Problem Relation Age of Onset   Alzheimer's disease Mother    Dementia Sister     Social History   Tobacco Use   Smoking status:  Former    Types: Cigarettes   Smokeless tobacco: Former  Building services engineer Use: Never used  Substance Use Topics   Alcohol use: Never   Drug use: Never    Home Medications Prior to Admission medications   Medication Sig Start Date End Date Taking? Authorizing Provider  amLODipine (NORVASC) 5 MG tablet Take 1 tablet (5 mg total) by mouth daily. 11/23/20   Ngetich, Dinah C, NP  atenolol (TENORMIN) 25 MG tablet Take 1 tablet (25 mg total) by mouth at bedtime. 11/23/20   Ngetich, Dinah C, NP  busPIRone (BUSPAR) 15 MG tablet Take 1 tablet (15 mg total) by mouth 2 (two) times daily. 11/23/20   Ngetich, Dinah C, NP  divalproex (DEPAKOTE) 125 MG DR tablet Take 1 tablet (125 mg total) by mouth 2 (two) times daily. 11/23/20   Ngetich, Dinah C, NP  divalproex (DEPAKOTE) 250 MG DR tablet Take 1 tablet (250 mg total) by mouth 2 (two) times daily. 11/23/20   Ngetich, Dinah C, NP  donepezil (ARICEPT) 5 MG tablet Take 1 tablet (5 mg total) by mouth at bedtime. 11/23/20   Ngetich, Dinah C, NP  furosemide (LASIX) 40 MG tablet Take 1 tablet (40 mg total) by mouth daily. 11/23/20   Ngetich, Dinah C, NP  memantine (NAMENDA XR) 28 MG CP24 24 hr capsule Take 1 capsule (28 mg total) by mouth daily. 12/21/20   Ngetich, Donalee Citrin, NP  Multiple Vitamins-Minerals (  ABC COMPLETE SENIOR WOMENS 50+) TABS Take 1 tablet by mouth daily. 10/24/20   [provider]  potassium chloride SA (KLOR-CON) 20 MEQ tablet Take 1 tablet (20 mEq total) by mouth daily. 11/23/20   Ngetich, Dinah C, NP  risperiDONE (RISPERDAL M-TABS) 1 MG disintegrating tablet Take 1 tablet (1 mg total) by mouth 2 (two) times daily. 11/23/20   Ngetich, Dinah C, NP  simvastatin (ZOCOR) 40 MG tablet Take 1 tablet (40 mg total) by mouth daily. 11/23/20   Ngetich, Dinah C, NP  traZODone (DESYREL) 100 MG tablet Take 1 tablet (100 mg total) by mouth at bedtime. 11/23/20   Ngetich, Dinah C, NP  traZODone (DESYREL) 50 MG tablet Take 0.5 tablets (25 mg total) by mouth 2 (two) times  daily. 11/23/20   Ngetich, Donalee Citrin, NP    Allergies    Patient has no known allergies.  Review of Systems   Review of Systems  Constitutional:  Positive for activity change. Negative for chills and fever.  HENT:  Negative for ear pain and sore throat.   Eyes:  Negative for pain and visual disturbance.  Respiratory:  Negative for cough and shortness of breath.   Cardiovascular:  Negative for chest pain and palpitations.  Gastrointestinal:  Negative for abdominal pain and vomiting.  Genitourinary:  Negative for dysuria and hematuria.  Musculoskeletal:  Negative for arthralgias and back pain.  Skin:  Negative for color change and rash.  Neurological:  Positive for weakness. Negative for seizures and syncope.  All other systems reviewed and are negative.  Physical Exam Updated Vital Signs BP 111/76 (BP Location: Left Arm)   Pulse 72   Temp 99 F (37.2 C) (Oral)   Resp 16   Ht 5\' 8"  (1.727 m)   Wt 50 kg   SpO2 95%   BMI 16.76 kg/m   Physical Exam Vitals and nursing note reviewed.  Constitutional:      General: She is not in acute distress.    Appearance: She is well-developed.  HENT:     Head: Normocephalic and atraumatic.  Eyes:     Conjunctiva/sclera: Conjunctivae normal.  Cardiovascular:     Rate and Rhythm: Normal rate and regular rhythm.     Heart sounds: No murmur heard. Pulmonary:     Effort: Pulmonary effort is normal. No respiratory distress.     Breath sounds: Normal breath sounds.  Abdominal:     Palpations: Abdomen is soft.     Tenderness: There is no abdominal tenderness.  Musculoskeletal:     Cervical back: Neck supple.  Skin:    General: Skin is warm and dry.  Neurological:     Mental Status: She is alert. She is disoriented.     Comments: lethargic    ED Results / Procedures / Treatments   Labs (all labs ordered are listed, but only abnormal results are displayed) Labs Reviewed  CBG MONITORING, ED - Abnormal; Notable for the following  components:      Result Value   Glucose-Capillary 108 (*)    All other components within normal limits  BASIC METABOLIC PANEL  CBC  URINALYSIS, ROUTINE W REFLEX MICROSCOPIC  LACTIC ACID, PLASMA  LACTIC ACID, PLASMA    EKG None  Radiology No results found.  Procedures Procedures   Medications Ordered in ED Medications - No data to display  ED Course  I have reviewed the triage vital signs and the nursing notes.  Pertinent labs & imaging results that were available during my  care of the patient were reviewed by me and considered in my medical decision making (see chart for details).    MDM Rules/Calculators/A&P                           Continue to emergency department for evaluation of lethargy.  Physical exam is largely unremarkable, but patient lethargic.  Patient will awake and arouse to minimal noxious stimuli.  Evaluation reveals a hemoglobin of 11.9 with an MCV of one 1.1 but is otherwise unremarkable.  Urinalysis reveals a grossly infected urine with large leuk esterase and greater than 50 white blood cells.  CT head is currently pending.  Ceftriaxone initiated and patient will require admission for generalized weakness and UTI. Final Clinical Impression(s) / ED Diagnoses Final diagnoses:  None    Rx / DC Orders ED Discharge Orders     None        Emelee Rodocker, MD 12/27/20 1510

## 2020-12-27 NOTE — Progress Notes (Signed)
Patient placed on seizure precautions RN called to get seizure pads for bed but [facility] does not have any available at this time. RN placed pillows around patients head to guard from rails.

## 2020-12-27 NOTE — ED Triage Notes (Signed)
Pt presents to ED via ems cc weakness. Per ems, family states pt has gotten increasingly weak over the past several days. Per ems, pt has dementia and is at baselihne. Pt respirations equal, unlabored. Pt responds to tactile stimulation.

## 2020-12-27 NOTE — H&P (Signed)
History and Physical    Chelsea Aguirre DJS:[id number] DOB: January 06, 1951 DOA: 12/27/2020  PCP: Caesar Bookman, NP  Patient coming from: Home  I have personally briefly reviewed patient's old medical records in [facility]  Chief Complaint: Generalized weakness  HPI: Chelsea Aguirre is a 70 y.o. female with medical history significant of dementia, hypertension, hyperlipidemia presents for evaluation of generalized weakness and progressive decline in activity.  As per daughter: Patient is nonverbal and pleasantly demented at the baseline.  She does not follow commands, does not recognize her family member at baseline.  She able to ambulate without difficulty however since last couple of days she has significant decline in her baseline activity.  She is no longer ambulatory anymore.  She is weak, lethargic, confused.  Daughter is concerned about her symptoms.  No fever, vomiting, diarrhea, chest pain, melena, urinary symptoms, hematemesis.  Of note: Patient goes to daytime skilled nursing memory care unit but returns home in the evenings.  No history of smoking, alcohol, illicit drug use.  ED Course: Upon arrival to ED: Patient afebrile, blood pressure soft, no leukocytosis, lactic acid: WNL, UA positive for leukocytes, WBCs, bacteria.  CBC shows H&H of 11.9/36.0, MCV 101.1.  BMP shows creatinine of 1.0 2, GFR 5 9.  CT head negative for acute findings.  Patient was given IV Rocephin.  [facility] hospital[facility] consulted for admission for generalized weakness due to possible UTI.  Review of Systems: As per HPI otherwise negative.    Past Medical History:  Diagnosis Date   Anxiety    Dementia (HCC)    Hyperlipidemia    Hypertension     Past Surgical History:  Procedure Laterality Date   ABDOMINAL HYSTERECTOMY       reports that she has quit smoking. Her smoking use included cigarettes. She has quit using smokeless tobacco. She reports that she does not drink alcohol and does not use  drugs.  Allergies  Allergen Reactions   Donepezil Diarrhea and Other (See Comments)    Stopped by MD due to concern for Colitis    Family History  Problem Relation Age of Onset   Alzheimer's disease Mother    Dementia Sister     Prior to Admission medications   Medication Sig Start Date End Date Taking? Authorizing Provider  amLODipine (NORVASC) 5 MG tablet Take 1 tablet (5 mg total) by mouth daily. 11/23/20  Yes Ngetich, Dinah C, NP  atenolol (TENORMIN) 25 MG tablet Take 1 tablet (25 mg total) by mouth at bedtime. 11/23/20  Yes Ngetich, Dinah C, NP  busPIRone (BUSPAR) 15 MG tablet Take 1 tablet (15 mg total) by mouth 2 (two) times daily. Patient taking differently: Take 15 mg by mouth in the morning and at bedtime. 11/23/20  Yes Ngetich, Dinah C, NP  divalproex (DEPAKOTE) 125 MG DR tablet Take 1 tablet (125 mg total) by mouth 2 (two) times daily. 11/23/20  Yes Ngetich, Dinah C, NP  divalproex (DEPAKOTE) 250 MG DR tablet Take 1 tablet (250 mg total) by mouth 2 (two) times daily. 11/23/20  Yes Ngetich, Dinah C, NP  furosemide (LASIX) 40 MG tablet Take 1 tablet (40 mg total) by mouth daily. Patient taking differently: Take 40 mg by mouth in the morning. 11/23/20  Yes Ngetich, Dinah C, NP  loperamide (IMODIUM A-D) 2 MG tablet Take 2 mg by mouth 4 (four) times daily as needed for diarrhea or loose stools.   Yes [provider]  memantine (NAMENDA XR) 28 MG CP24 24  hr capsule Take 1 capsule (28 mg total) by mouth daily. Patient taking differently: Take 28 mg by mouth at bedtime. 12/21/20  Yes Ngetich, Dinah C, NP  Multiple Vitamins-Minerals (ABC COMPLETE SENIOR WOMENS 50+) TABS Take 1 tablet by mouth daily. 10/24/20  Yes [provider]  potassium chloride SA (KLOR-CON) 20 MEQ tablet Take 1 tablet (20 mEq total) by mouth daily. Patient taking differently: Take 20 mEq by mouth in the morning. 11/23/20  Yes Ngetich, Dinah C, NP  risperiDONE (RISPERDAL) 1 MG tablet Take 1 mg by mouth in the  morning and at bedtime. 12/17/20  Yes [provider]  simvastatin (ZOCOR) 40 MG tablet Take 1 tablet (40 mg total) by mouth daily. Patient taking differently: Take 40 mg by mouth at bedtime. 11/23/20  Yes Ngetich, Dinah C, NP  traZODone (DESYREL) 100 MG tablet Take 1 tablet (100 mg total) by mouth at bedtime. 11/23/20  Yes Ngetich, Dinah C, NP  donepezil (ARICEPT) 5 MG tablet Take 1 tablet (5 mg total) by mouth at bedtime. Patient not taking: No sig reported 11/23/20   Ngetich, Dinah C, NP  risperiDONE (RISPERDAL M-TABS) 1 MG disintegrating tablet Take 1 tablet (1 mg total) by mouth 2 (two) times daily. Patient not taking: No sig reported 11/23/20   Ngetich, Dinah C, NP  traZODone (DESYREL) 50 MG tablet Take 0.5 tablets (25 mg total) by mouth 2 (two) times daily. Patient not taking: Reported on 12/27/2020 11/23/20   Ngetich, Donalee Citrin, NP    Physical Exam: Vitals:   12/27/20 1335 12/27/20 1400 12/27/20 1505  BP: 111/76 97/69   Pulse: 72 (!) 160 65  Resp: 16 (!) 21 18  Temp: 99 F (37.2 C)    TempSrc: Oral    SpO2: 95% (!) 33% 96%  Weight: 50 kg    Height: 5\' 8"  (1.727 m)      Constitutional: NAD, calm, comfortable, on room air, ill appearing elderly female lying comfortably on the bed, not following commands, appears very dehydrated Eyes: PERRL, lids and conjunctivae normal ENMT: Mucous membranes are dry.  Neck: normal, supple, no masses, no thyromegaly Respiratory: clear to auscultation bilaterally, no wheezing, no crackles. Normal respiratory effort. No accessory muscle use.  Cardiovascular: Regular rate and rhythm, no murmurs / rubs / gallops. No extremity edema. 2+ pedal pulses. No carotid bruits.  Abdomen: no tenderness, no masses palpated. No hepatosplenomegaly. Bowel sounds positive.  Musculoskeletal: no clubbing / cyanosis. No joint deformity upper and lower extremities. Good ROM, no contractures. Normal muscle tone.  Skin: Multiple skin bruises noted in both arms  neurologic:  Lying comfortably.  Not following commands.  Unable to perform neurological exam due to patient's dementia.   Labs on Admission: I have personally reviewed following labs and imaging studies  CBC: Recent Labs  Lab 12/27/20 1339  WBC 8.0  HGB 11.9*  HCT 36.0  MCV 101.1*  PLT 186   Basic Metabolic Panel: Recent Labs  Lab 12/27/20 1339  NA 143  K 4.1  CL 102  CO2 31  GLUCOSE 107*  BUN 23  CREATININE 1.02*  CALCIUM 9.1   GFR: Estimated Creatinine Clearance: 40.5 mL/min (A) (by C-G formula based on SCr of 1.02 mg/dL (H)). Liver Function Tests: No results for input(s): AST, ALT, ALKPHOS, BILITOT, PROT, ALBUMIN in the last 168 hours. No results for input(s): LIPASE, AMYLASE in the last 168 hours. No results for input(s): AMMONIA in the last 168 hours. Coagulation Profile: No results for input(s): INR, PROTIME in  the last 168 hours. Cardiac Enzymes: No results for input(s): CKTOTAL, CKMB, CKMBINDEX, TROPONINI in the last 168 hours. BNP (last 3 results) No results for input(s): PROBNP in the last 8760 hours. HbA1C: No results for input(s): HGBA1C in the last 72 hours. CBG: Recent Labs  Lab 12/27/20 1351  GLUCAP 108*   Lipid Profile: No results for input(s): CHOL, HDL, LDLCALC, TRIG, CHOLHDL, LDLDIRECT in the last 72 hours. Thyroid Function Tests: No results for input(s): TSH, T4TOTAL, FREET4, T3FREE, THYROIDAB in the last 72 hours. Anemia Panel: No results for input(s): VITAMINB12, FOLATE, FERRITIN, TIBC, IRON, RETICCTPCT in the last 72 hours. Urine analysis:    Component Value Date/Time   COLORURINE YELLOW 12/27/2020 1348   APPEARANCEUR CLOUDY (A) 12/27/2020 1348   LABSPEC 1.008 12/27/2020 1348   PHURINE 6.0 12/27/2020 1348   GLUCOSEU NEGATIVE 12/27/2020 1348   HGBUR NEGATIVE 12/27/2020 1348   BILIRUBINUR NEGATIVE 12/27/2020 1348   KETONESUR 5 (A) 12/27/2020 1348   PROTEINUR NEGATIVE 12/27/2020 1348   NITRITE NEGATIVE 12/27/2020 1348   LEUKOCYTESUR LARGE (A)  12/27/2020 1348    Radiological Exams on Admission: No results found.  EKG: Independently reviewed.  Sinus rhythm.  No acute ST-T wave changes noted  Assessment/Plan Principal Problem:   UTI (urinary tract infection) Active Problems:   Essential hypertension   Mixed hyperlipidemia   Generalized anxiety disorder   Dementia (HCC)   AKI (acute kidney injury) (HCC)   Macrocytic anemia   Generalized weakness  Progressive worsening dementia likely in the setting of UTI: -UA is positive for infection.  She is afebrile with no leukocytosis.  Lactic acid: WNL. sent urine for culture.  Receive IV Rocephin in ED. -CT head negative for acute findings. -Admit patient on the floor.  Continue IV Rocephin. -Start on gentle hydration. -We will consult PT/OT/SLP  Hypertension: Hold home BP meds-amlodipine, atenolol at this time as blood pressure is soft.  Continue gentle hydration.  Resume medications once blood pressure is back to baseline  AKI: -Mild.  Continue IV hydration.  Avoid nephrotoxic medication.  Repeat BMP tomorrow a.m.  Hyperlipidemia: Continue statin  Dementia: -Continue home meds Depakote, Namenda  Depression/anxiety/behavioral disturbance: Continue BuSpar, trazodone, risperidone  Macrocytic anemia: -H&H is stable.  Check TSH, B12, folate. -Monitor H&H  Bilateral lower extremity edema: -Hold Lasix for now.  As patient is dehydrated, AKI  DVT prophylaxis: Lovenox/SCD Code Status: DNR-confirmed with patient's daughter at bedside Family Communication: Patient's daughter present at bedside.  Plan of care discussed with patient in length and she verbalized understanding and agreed with it. Disposition Plan: To be determined Consults called: None Admission status: Inpatient   Ollen Bowl MD [facility]  If 7PM-7AM, please contact night-coverage [email or web address]  12/27/2020, 3:11 PM

## 2020-12-27 NOTE — Telephone Encounter (Signed)
I agree with plan to call 9-1-1

## 2020-12-28 ENCOUNTER — Ambulatory Visit: Payer: Self-pay | Admitting: Gastroenterology

## 2020-12-28 ENCOUNTER — Ambulatory Visit: Payer: PPO | Admitting: Family

## 2020-12-28 LAB — CBC
HCT: 28.3 % — ABNORMAL LOW (ref 36.0–46.0)
Hemoglobin: 9.2 g/dL — ABNORMAL LOW (ref 12.0–15.0)
MCH: 33.9 pg (ref 26.0–34.0)
MCHC: 32.5 g/dL (ref 30.0–36.0)
MCV: 104.4 fL — ABNORMAL HIGH (ref 80.0–100.0)
Platelets: 149 10*3/uL — ABNORMAL LOW (ref 150–400)
RBC: 2.71 MIL/uL — ABNORMAL LOW (ref 3.87–5.11)
RDW: 14 % (ref 11.5–15.5)
WBC: 5.3 10*3/uL (ref 4.0–10.5)
nRBC: 0 % (ref 0.0–0.2)

## 2020-12-28 LAB — BASIC METABOLIC PANEL
Anion gap: 8 (ref 5–15)
BUN: 19 mg/dL (ref 8–23)
CO2: 27 mmol/L (ref 22–32)
Calcium: 8.2 mg/dL — ABNORMAL LOW (ref 8.9–10.3)
Chloride: 105 mmol/L (ref 98–111)
Creatinine, Ser: 0.75 mg/dL (ref 0.44–1.00)
GFR, Estimated: >60 mL/min (ref >60)
Glucose, Bld: 94 mg/dL (ref 70–99)
Potassium: 4 mmol/L (ref 3.5–5.1)
Sodium: 140 mmol/L (ref 135–145)

## 2020-12-28 LAB — SARS CORONAVIRUS 2 (TAT 6-24 HRS): SARS Coronavirus 2: NEGATIVE

## 2020-12-28 MED ORDER — BOOST / RESOURCE BREEZE PO LIQD CUSTOM
1.0000 | ORAL | Status: DC
  Administered 2020-12-30: 1 via ORAL

## 2020-12-28 MED ORDER — ENSURE ENLIVE PO LIQD
237.0000 mL | Freq: Two times a day (BID) | ORAL | Status: DC
  Administered 2020-12-28 – 2020-12-30 (×4): 237 mL via ORAL

## 2020-12-28 MED ORDER — ADULT MULTIVITAMIN W/MINERALS CH
1.0000 | ORAL_TABLET | Freq: Every day | ORAL | Status: DC
  Administered 2020-12-28 – 2021-01-01 (×5): 1 via ORAL
  Filled 2020-12-28 (×5): qty 1

## 2020-12-28 NOTE — Progress Notes (Signed)
PT Cancellation Note  Patient Details Name: Chelsea Aguirre MRN: [id number] DOB: 02-06-1951   Cancelled Treatment:    Reason Eval/Treat Not Completed: Fatigue/lethargy limiting ability to participate (pt sleeping soundly, unable to arouse her with verbal/tactile stimulation. Will follow.)  Tamala Ser PT 12/28/2020  Acute Rehabilitation Services Pager [phone] Office [phone]

## 2020-12-28 NOTE — Evaluation (Signed)
Occupational Therapy Evaluation Patient Details Name: Chelsea Aguirre MRN: [id number] DOB: Jan 24, 1951 Today's Date: 12/28/2020    History of Present Illness Patient is a 70 year old female presenting with generalized weakness and progressive decline in activity. Patient diagnosed with UTI. PMH significant for dementia, hypertension, hyperlipidemia   Clinical Impression   Patient with advanced dementia, would not tell me her name or date of birth. Often making non-sensical statements such as "birds in the sky" or "red pattern there." Per chart review lives with family, goes to memory care program during the day and ambulatory at baseline. Unfortunately could not get patient to initiate tasks with multimodal cues, only ~25% of the time would patient follow through such as washing her face when wash cloth placed in her hand. Even then patient only performed ~25% of task before setting down cloth. Patient total A for supine to sit and sit to supine as she would not initiate herself. Attempted sit to stand twice at edge of bed however patient resistive (not combative) and would not assist at all as OT attempt to use bed pad to stand patient. Unfortunately given patient's advanced dementia and not following cues/directions is not appropriate for acute OT services. Will sign off.       Follow Up Recommendations  Supervision/Assistance - 24 hour    Equipment Recommendations  None recommended by OT       Precautions / Restrictions Precautions Precautions: Fall Restrictions Weight Bearing Restrictions: No      Mobility Bed Mobility Overal bed mobility: Needs Assistance Bed Mobility: Supine to Sit;Sit to Supine     Supine to sit: Total assist;HOB elevated Sit to supine: Total assist   General bed mobility comments: would not initiate herself, used bed pad to transition patient from supine <> sit    Transfers                 General transfer comment: unable. Made two attempts to stand  patient from edge of bed with 1 assist however patient resistive (not combative) and not assisting at all. therefore transitioned back to bed        ADL either performed or assessed with clinical judgement   ADL Overall ADL's : At baseline                                       General ADL Comments: patient following approximately 25% of directions when prompted. Did place wash cloth in patient's hand to wash her face as she was picking at her eyes. Patient washed ~25% of her face of own volition. Also rubbed lotion in when placed in her hands but would not rub in lotion placed on her arms. Patient appears to only perform tasks when of own volition otherwise will not initiate when prompted by OT.      Pertinent Vitals/Pain Pain Assessment: Faces Faces Pain Scale: No hurt     Hand Dominance  (unable specify)   Extremity/Trunk Assessment Upper Extremity Assessment Upper Extremity Assessment: Difficult to assess due to impaired cognition   Lower Extremity Assessment Lower Extremity Assessment: Defer to PT evaluation       Communication Communication Communication: Expressive difficulties (non sensical speech)   Cognition Arousal/Alertness: Awake/alert Behavior During Therapy: Flat affect Overall Cognitive Status: History of cognitive impairments - at baseline  General Comments: advanced dementia              Home Living Family/patient expects to be discharged to:: Unsure Living Arrangements: Children (daughter)                                      Prior Functioning/Environment          Comments: per chart review patient goes to a memory care program during the day and lives with daughter at night. Does not follow directions or recognize family. Was ambulatory        OT Problem List: Decreased activity tolerance         OT Goals(Current goals can be found in the care plan section)  Acute Rehab OT Goals Patient Stated Goal: "birds flying up" OT Goal Formulation: Patient unable to participate in goal setting   AM-PAC OT "6 Clicks" Daily Activity     Outcome Measure Help from another person eating meals?: A Lot Help from another person taking care of personal grooming?: A Lot Help from another person toileting, which includes using toliet, bedpan, or urinal?: A Lot Help from another person bathing (including washing, rinsing, drying)?: A Lot Help from another person to put on and taking off regular upper body clothing?: A Lot Help from another person to put on and taking off regular lower body clothing?: A Lot 6 Click Score: 12   End of Session Nurse Communication: Mobility status  Activity Tolerance: Other (comment) (Treatment limited secondary cognition) Patient left: in bed;with call bell/phone within reach;with bed alarm set  OT Visit Diagnosis: Other abnormalities of gait and mobility (R26.89)                Time: 2947-6546 OT Time Calculation (min): 19 min Charges:  OT General Charges $OT Visit: 1 Visit OT Evaluation $OT Eval Low Complexity: 1 Low  Marlyce Huge OT OT pager: [phone]  Carmelia Roller 12/28/2020, 11:42 AM

## 2020-12-28 NOTE — Plan of Care (Signed)
  Problem: Nutrition: Goal: Adequate nutrition will be maintained Outcome: Progressing   Problem: Pain Managment: Goal: General experience of comfort will improve Outcome: Progressing   

## 2020-12-28 NOTE — Plan of Care (Signed)

## 2020-12-28 NOTE — Progress Notes (Signed)
PROGRESS NOTE    Chelsea Aguirre  BMW:[id number] DOB: 1951/04/03 DOA: 12/27/2020 PCP: Caesar Bookman, NP     Brief Narrative:  Chelsea Aguirre is a 70 year old female with advanced dementia, hyperlipidemia, hypertension who presented to the emergency department due to weakness and decline at home.  Daughter is patient's main caregiver, states that patient has relocated from her home in [place] to [place] so daughter could take care of her.  At baseline, patient is mostly nonverbal, and when she does speak it is largely nonsensical, does not recognize family any longer.  She is dependent in all activities of daily living.  Patient has declined significantly over the past month, now is more lethargic, has not really eaten much in the past week.  It has become increasingly more difficult to take care of her at home.  New events last 24 hours / Subjective: Patient sleeping comfortably, no significant change per bedside RN.  Awaiting speech therapy to evaluate swallow  Assessment & Plan:   Principal Problem:   UTI (urinary tract infection) Active Problems:   Essential hypertension   Mixed hyperlipidemia   Generalized anxiety disorder   Dementia (HCC)   AKI (acute kidney injury) (HCC)   Macrocytic anemia   UTI, present on admission -Urine cultures pending -Continue IV Rocephin  Advanced dementia and poor oral intake History of depression and anxiety -Recommend palliative care consultation and consideration for hospice -Continue Depakote, Namenda, BuSpar, trazodone, risperidone  Hyperlipidemia -Continue zocor   HTN -Hold antihypertensives for now due to marginal BP  DVT prophylaxis:  enoxaparin (LOVENOX) injection 40 mg Start: 12/27/20 2200 SCDs Start: 12/27/20 1510  Code Status:     Code Status Orders  (From admission, onward)           Start     Ordered   12/27/20 1550  Do not attempt resuscitation (DNR)  Continuous       Question Answer Comment  In the event of  cardiac or respiratory ARREST Do not call a "code blue"   In the event of cardiac or respiratory ARREST Do not perform Intubation, CPR, defibrillation or ACLS   In the event of cardiac or respiratory ARREST Use medication by any route, position, wound care, and other measures to relive pain and suffering. May use oxygen, suction and manual treatment of airway obstruction as needed for comfort.      12/27/20 1549           Code Status History     Date Active Date Inactive Code Status Order ID Comments User Context   12/27/2020 1510 12/27/2020 1549 Full Code [id number]  Ollen Bowl, MD ED      Advance Directive Documentation    Flowsheet Row Most Recent Value  Type of Advance Directive Healthcare Power of Attorney, Living will  Pre-existing out of facility DNR order (yellow form or pink MOST form) --  "MOST" Form in Place? --      Family Communication: Discussed with daughter over the phone Disposition Plan:  Status is: Inpatient  Remains inpatient appropriate because:Altered mental status and Unsafe d/c plan  Dispo: The patient is from: Home              Anticipated d/c is to:  TBD              Patient currently is not medically stable to d/c.   Difficult to place patient No      Consultants:  None  Procedures:  None   Antimicrobials:  Anti-infectives (From admission, onward)    Start     Dose/Rate Route Frequency Ordered Stop   12/28/20 1000  cefTRIAXone (ROCEPHIN) 1 g in sodium chloride 0.9 % 100 mL IVPB        1 g 200 mL/hr over 30 Minutes Intravenous Daily 12/27/20 1526     12/28/20 0000  cefTRIAXone (ROCEPHIN) 1 g in sodium chloride 0.9 % 100 mL IVPB  Status:  Discontinued        1 g 200 mL/hr over 30 Minutes Intravenous  Once 12/27/20 1523 12/27/20 1526   12/27/20 1445  cefTRIAXone (ROCEPHIN) 1 g in sodium chloride 0.9 % 100 mL IVPB        1 g 200 mL/hr over 30 Minutes Intravenous  Once 12/27/20 1436 12/27/20 1559        Objective: Vitals:    12/27/20 2208 12/28/20 0221 12/28/20 0500 12/28/20 0615  BP: 121/79 (!) 98/58  (!) 100/45  Pulse: 76 (!) 56  (!) 58  Resp: 15 14  18   Temp: 98.1 F (36.7 C) 98 F (36.7 C)  98.3 F (36.8 C)  TempSrc: Oral Oral  Oral  SpO2: 91% 98%  99%  Weight:   47.9 kg   Height:        Intake/Output Summary (Last 24 hours) at 12/28/2020 1317 Last data filed at 12/28/2020 1000 Gross per 24 hour  Intake 3301.82 ml  Output 500 ml  Net 2801.82 ml   Filed Weights   12/27/20 1335 12/28/20 0500  Weight: 50 kg 47.9 kg    Examination:  General exam: Appears calm and comfortable  Respiratory system: Clear to auscultation. Respiratory effort normal. No respiratory distress.  Cardiovascular system: S1 & S2 heard, RRR. No murmurs. No pedal edema. Gastrointestinal system: Abdomen is nondistended, soft and nontender Extremities: Symmetric in appearance  Skin: No rashes, lesions or ulcers on exposed skin   Data Reviewed: I have personally reviewed following labs and imaging studies  CBC: Recent Labs  Lab 12/27/20 1339 12/27/20 1709 12/28/20 0455  WBC 8.0 7.3 5.3  HGB 11.9* 10.5* 9.2*  HCT 36.0 32.5* 28.3*  MCV 101.1* 102.8* 104.4*  PLT 186 171 149*   Basic Metabolic Panel: Recent Labs  Lab 12/27/20 1339 12/27/20 1709 12/28/20 0455  NA 143  --  140  K 4.1  --  4.0  CL 102  --  105  CO2 31  --  27  GLUCOSE 107*  --  94  BUN 23  --  19  CREATININE 1.02* 0.89 0.75  CALCIUM 9.1  --  8.2*  MG  --  2.2  --   PHOS  --  4.2  --    GFR: Estimated Creatinine Clearance: 49.5 mL/min (by C-G formula based on SCr of 0.75 mg/dL). Liver Function Tests: No results for input(s): AST, ALT, ALKPHOS, BILITOT, PROT, ALBUMIN in the last 168 hours. No results for input(s): LIPASE, AMYLASE in the last 168 hours. No results for input(s): AMMONIA in the last 168 hours. Coagulation Profile: No results for input(s): INR, PROTIME in the last 168 hours. Cardiac Enzymes: No results for input(s): CKTOTAL,  CKMB, CKMBINDEX, TROPONINI in the last 168 hours. BNP (last 3 results) No results for input(s): PROBNP in the last 8760 hours. HbA1C: No results for input(s): HGBA1C in the last 72 hours. CBG: Recent Labs  Lab 12/27/20 1351  GLUCAP 108*   Lipid Profile: No results for input(s): CHOL, HDL, LDLCALC, TRIG, CHOLHDL, LDLDIRECT in the last 72 hours. Thyroid  Function Tests: Recent Labs    12/27/20 1709  TSH 1.983   Anemia Panel: Recent Labs    12/27/20 1709  VITAMINB12 498  FOLATE 26.9   Sepsis Labs: Recent Labs  Lab 12/27/20 1357 12/27/20 1709  LATICACIDVEN 1.1 2.0*    Recent Results (from the past 240 hour(s))  SARS CORONAVIRUS 2 (TAT 6-24 HRS) Nasopharyngeal Nasopharyngeal Swab     Status: None   Collection Time: 12/27/20  3:21 PM   Specimen: Nasopharyngeal Swab  Result Value Ref Range Status   SARS Coronavirus 2 NEGATIVE NEGATIVE Final    Comment: (NOTE) SARS-CoV-2 target nucleic acids are NOT DETECTED.  The SARS-CoV-2 RNA is generally detectable in upper and lower respiratory specimens during the acute phase of infection. Negative results do not preclude SARS-CoV-2 infection, do not rule out co-infections with other pathogens, and should not be used as the sole basis for treatment or other patient management decisions. Negative results must be combined with clinical observations, patient history, and epidemiological information. The expected result is Negative.  Fact Sheet for Patients: HairSlick.no  Fact Sheet for Healthcare Providers: quierodirigir.com  This test is not yet approved or cleared by the [place] FDA and  has been authorized for detection and/or diagnosis of SARS-CoV-2 by FDA under an Emergency Use Authorization (EUA). This EUA will remain  in effect (meaning this test can be used) for the duration of the COVID-19 declaration under Se ction 564(b)(1) of the Act, 21 U.S.C. section  360bbb-3(b)(1), unless the authorization is terminated or revoked sooner.  Performed at [facility], [address], [place], [place] [address]       Radiology Studies: CT HEAD WO CONTRAST ( )  Result Date: 12/27/2020 CLINICAL DATA:  Delirium EXAM: CT HEAD WITHOUT CONTRAST TECHNIQUE: Contiguous axial images were obtained from the base of the skull through the vertex without intravenous contrast. COMPARISON:  None. FINDINGS: Brain: No evidence of acute infarction, hemorrhage, hydrocephalus, extra-axial collection or mass lesion/mass effect. Mild low-density changes within the periventricular and subcortical white matter compatible with chronic microvascular ischemic change. Mild diffuse cerebral volume loss. Vascular: Atherosclerotic calcifications involving the large vessels of the skull base. No unexpected hyperdense vessel. Skull: Normal. Negative for fracture or focal lesion. Sinuses/Orbits: No acute finding. Other: None. IMPRESSION: 1. No acute intracranial findings. 2. Chronic microvascular ischemic change and cerebral volume loss. Electronically Signed   By: Duanne Guess D.O.   On: 12/27/2020 15:15      Scheduled Meds:  busPIRone  15 mg Oral BID   divalproex  125 mg Oral BID   divalproex  250 mg Oral BID   enoxaparin (LOVENOX) injection  40 mg Subcutaneous Q24H   memantine  28 mg Oral Daily   risperiDONE  1 mg Oral Daily   simvastatin  40 mg Oral q1800   traZODone  100 mg Oral QHS   Continuous Infusions:  sodium chloride 75 mL/hr at 12/28/20 0850   cefTRIAXone (ROCEPHIN)  IV 1 g (12/28/20 0855)     LOS: 1 day      Time spent: 30 minutes   Noralee Stain, DO [facility] 12/28/2020, 1:17 PM   Available via Epic secure chat 7am-7pm After these hours, please refer to coverage provider listed on [email or web address]

## 2020-12-28 NOTE — Evaluation (Signed)
Clinical/Bedside Swallow Evaluation Patient Details  Name: Chelsea Aguirre MRN: [id number] Date of Birth: Nov 14, 1950  Today's Date: 12/28/2020 Time: SLP Start Time (ACUTE ONLY): 1432 SLP Stop Time (ACUTE ONLY): 1456 SLP Time Calculation (min) (ACUTE ONLY): 24 min  Past Medical History:  Past Medical History:  Diagnosis Date   Anxiety    Dementia (HCC)    Hyperlipidemia    Hypertension    Past Surgical History:  Past Surgical History:  Procedure Laterality Date   ABDOMINAL HYSTERECTOMY     HPI:  Chelsea Aguirre is a 70 year old female with advanced dementia, hyperlipidemia, hypertension who presented to the emergency department due to weakness and decline at home. Found to have UTI. Per chart at baseline is mostly nonverbal, decreased PO intake.   Assessment / Plan / Recommendation Clinical Impression  Pt's daughter reports considering puree prior to admit due to decline in function and fit of dentures. She denied couging with liquids/solids or significant oral holding. She is becoming more alert and aroused and responded appropirately to several questions. Adequate oral acceptance with straw and spoon of thin and puree. Timely appearance of swallow without indications of compromised airway. Discussed textures with daughter and agreed puree (Dys 1) is most appropriate while she is recovering and likely beyond discharge, crrush meds. Educated daughter re: po intake as dementia progresses. No further ST needed at this time. SLP Visit Diagnosis: Dysphagia, unspecified (R13.10)    Aspiration Risk  Mild aspiration risk    Diet Recommendation Dysphagia 1 (Puree);Thin liquid   Liquid Administration via: Cup;Straw Medication Administration: Crushed with puree Supervision: Full supervision/cueing for compensatory strategies;Staff to assist with self feeding Compensations: Lingual sweep for clearance of pocketing;Slow rate;Small sips/bites Postural Changes: Seated upright at 90 degrees    Other   Recommendations Oral Care Recommendations: Oral care BID   Follow up Recommendations Skilled Nursing facility      Frequency and Duration            Prognosis        Swallow Study   General Date of Onset: 12/27/20 HPI: Chelsea Aguirre is a 70 year old female with advanced dementia, hyperlipidemia, hypertension who presented to the emergency department due to weakness and decline at home. Found to have UTI. Per chart at baseline is mostly nonverbal, decreased PO intake. Type of Study: Bedside Swallow Evaluation Previous Swallow Assessment:  (none) Diet Prior to this Study: NPO Temperature Spikes Noted: No Respiratory Status: Room air History of Recent Intubation: No Behavior/Cognition: Cooperative;Confused;Requires cueing (adequate arousal) Oral Cavity Assessment: Other (comment) (unable to view) Oral Care Completed by SLP: No Oral Cavity - Dentition: Edentulous;Dentures, not available Self-Feeding Abilities: Total assist Patient Positioning: Upright in bed Baseline Vocal Quality: Normal Volitional Cough: Cognitively unable to elicit Volitional Swallow: Unable to elicit    Oral/Motor/Sensory Function Overall Oral Motor/Sensory Function:  (not follow commands- no focal weakness)   Ice Chips Ice chips: Not tested   Thin Liquid Thin Liquid: Within functional limits    Nectar Thick Nectar Thick Liquid: Not tested   Honey Thick Honey Thick Liquid: Not tested   Puree Puree: Within functional limits   Solid     Solid: Not tested      Royce Macadamia 12/28/2020,3:27 PM  Breck Coons Lonell Face.Ed Nurse, children's [phone] Office [phone]

## 2020-12-28 NOTE — Progress Notes (Signed)
Initial Nutrition Assessment  DOCUMENTATION CODES:   Severe malnutrition in context of chronic illness  INTERVENTION:  - will order Boost Breeze once/day, each supplement provides 250 kcal and 9 grams of protein. - will order Ensure Plus BID, each supplement provides 350 kcal and 20 grams of protein. - will order Magic Cup BID with meals, each supplement provides 290 kcal and 9 grams of protein. - will order 1 tablet multivitamin with minerals/day.    NUTRITION DIAGNOSIS:   Severe Malnutrition related to chronic illness (advanced dementia) as evidenced by severe fat depletion, severe muscle depletion.  GOAL:   Patient will meet greater than or equal to 90% of their needs  MONITOR:   PO intake, Supplement acceptance, Labs, Weight trends  REASON FOR ASSESSMENT:   Malnutrition Screening Tool, Consult Assessment of nutrition requirement/status  ASSESSMENT:   70 year old female with medical history of advanced dementia, HLD, and HTN. She presented to the ED due to weakness, significant decline in the past 1 month, lethargy, and poor PO intakes. She is mainly non-verbal at baseline. She is dependent for all ADLs.  Patient is disoriented x4. Her daughter was at bedside and provided all information as SLP prepared to do swallow evaluation with patient.   Patient has significantly declined over the past few weeks and has been sleeping more and has begun pocketing foods. She has dentures which fit well but they are removed any time patient is sleeping.   Patient does not follow even simple commands. She seems to enjoy sweet tasting things and will drink well if liquid is something other than water.   Weight yesterday was 106 lb and weight on 7/8 was 107 lb.   Per notes: - UTI - advanced dementia with poor oral intake - pending Palliative Care   Labs reviewed; Ca: 8.2 mg/dl.  Medications reviewed. IVF; NS @ 75 ml/hr.     NUTRITION - FOCUSED PHYSICAL EXAM:  Flowsheet Row  Most Recent Value  Orbital Region Moderate depletion  Upper Arm Region Severe depletion  Thoracic and Lumbar Region Unable to assess  Buccal Region Severe depletion  Temple Region Moderate depletion  Clavicle Bone Region Severe depletion  Clavicle and Acromion Bone Region Severe depletion  Scapular Bone Region Severe depletion  Dorsal Hand Moderate depletion  Patellar Region Moderate depletion  Anterior Thigh Region Moderate depletion  Posterior Calf Region Unable to assess  Edema (RD Assessment) None  Hair Reviewed  Eyes Reviewed  Mouth Unable to assess  Skin Reviewed  Nails Reviewed       Diet Order:   Diet Order             DIET - DYS 1 Room service appropriate? Yes; Fluid consistency: Thin  Diet effective now                   EDUCATION NEEDS:   No education needs have been identified at this time  Skin:  Skin Assessment: Reviewed RN Assessment  Last BM:  PTA/unknown  Height:   Ht Readings from Last 1 Encounters:  12/27/20 5\' 8"  (1.727 m)    Weight:   Wt Readings from Last 1 Encounters:  12/28/20 47.9 kg      Estimated Nutritional Needs:  Kcal:  1400-1650 kcal Protein:  70-85 grams Fluid:  >/= 1.8 L/day      02/27/21, MS, RD, LDN, CNSC Inpatient Clinical Dietitian RD pager # available in AMION  After hours/weekend pager # available in [facility]

## 2020-12-29 ENCOUNTER — Inpatient Hospital Stay (HOSPITAL_COMMUNITY): Payer: PPO

## 2020-12-29 ENCOUNTER — Encounter (HOSPITAL_COMMUNITY): Payer: Self-pay | Admitting: Internal Medicine

## 2020-12-29 DIAGNOSIS — F0390 Unspecified dementia without behavioral disturbance: Secondary | ICD-10-CM

## 2020-12-29 DIAGNOSIS — Z515 Encounter for palliative care: Secondary | ICD-10-CM

## 2020-12-29 DIAGNOSIS — Z7189 Other specified counseling: Secondary | ICD-10-CM

## 2020-12-29 DIAGNOSIS — E43 Unspecified severe protein-calorie malnutrition: Secondary | ICD-10-CM | POA: Insufficient documentation

## 2020-12-29 MED ORDER — ATORVASTATIN CALCIUM 20 MG PO TABS
20.0000 mg | ORAL_TABLET | Freq: Every day | ORAL | Status: DC
  Administered 2020-12-29 – 2021-01-01 (×4): 20 mg via ORAL
  Filled 2020-12-29 (×4): qty 1

## 2020-12-29 MED ORDER — ATENOLOL 25 MG PO TABS
25.0000 mg | ORAL_TABLET | Freq: Every day | ORAL | Status: DC
  Administered 2020-12-29 – 2021-01-01 (×4): 25 mg via ORAL
  Filled 2020-12-29 (×4): qty 1

## 2020-12-29 MED ORDER — AMLODIPINE BESYLATE 5 MG PO TABS
5.0000 mg | ORAL_TABLET | Freq: Every day | ORAL | Status: DC
  Administered 2020-12-29 – 2021-01-01 (×4): 5 mg via ORAL
  Filled 2020-12-29 (×4): qty 1

## 2020-12-29 MED ORDER — ENOXAPARIN SODIUM 30 MG/0.3ML IJ SOSY
30.0000 mg | PREFILLED_SYRINGE | INTRAMUSCULAR | Status: DC
  Administered 2020-12-29 – 2021-01-01 (×4): 30 mg via SUBCUTANEOUS
  Filled 2020-12-29 (×4): qty 0.3

## 2020-12-29 NOTE — Progress Notes (Signed)
PROGRESS NOTE    Chelsea Aguirre  OZH:[id number] DOB: 05/12/1951 DOA: 12/27/2020 PCP: Caesar BookmanNgetich, Dinah C, NP     Brief Narrative:  Chelsea Aguirre is a 70 year old female with advanced dementia, hyperlipidemia, hypertension who presented to the emergency department due to weakness and decline at home.  Daughter is patient's main caregiver, states that patient has [place] so daughter could take care of her.  At baseline, patient is mostly nonverbal, and when she does speak it is largely nonsensical, does not recognize family any longer.  She is dependent in all activities of daily living.  Patient has declined significantly over the past month, now is more lethargic, has not really eaten much in the past week.  It has become increasingly more difficult to take care of her at home.  New events last 24 hours / Subjective: Patient resting this morning.  No acute events reported overnight.  Assessment & Plan:   Principal Problem:   UTI (urinary tract infection) Active Problems:   Essential hypertension   Mixed hyperlipidemia   Generalized anxiety disorder   Dementia (HCC)   AKI (acute kidney injury) (HCC)   Macrocytic anemia   Protein-calorie malnutrition, severe   UTI, present on admission -Urine culture showing gram-negative rods -Continue IV Rocephin  Advanced dementia and poor oral intake History of depression and anxiety -Recommend palliative care consultation and consideration for hospice -Continue Depakote, Namenda, BuSpar, trazodone, risperidone  Hyperlipidemia -Continue zocor   HTN -Resume tenormin, norvasc   DVT prophylaxis:  enoxaparin (LOVENOX) injection 40 mg Start: 12/27/20 2200 SCDs Start: 12/27/20 1510  Code Status:     Code Status Orders  (From admission, onward)           Start     Ordered   12/27/20 1550  Do not attempt resuscitation (DNR)  Continuous       Question Answer Comment  In the event of cardiac or  respiratory ARREST Do not call a "code blue"   In the event of cardiac or respiratory ARREST Do not perform Intubation, CPR, defibrillation or ACLS   In the event of cardiac or respiratory ARREST Use medication by any route, position, wound care, and other measures to relive pain and suffering. May use oxygen, suction and manual treatment of airway obstruction as needed for comfort.      12/27/20 1549           Code Status History     Date Active Date Inactive Code Status Order ID Comments User Context   12/27/2020 1510 12/27/2020 1549 Full Code [id number]  Ollen BowlPahwani, Rinka R, MD ED      Advance Directive Documentation    Flowsheet Row Most Recent Value  Type of Advance Directive Healthcare Power of Attorney, Living will  Pre-existing out of facility DNR order (yellow form or pink MOST form) --  "MOST" Form in Place? --      Family Communication: None at bedside  Disposition Plan:  Status is: Inpatient  Remains inpatient appropriate because:Altered mental status and Unsafe d/c plan  Dispo: The patient is from: Home              Anticipated d/c is to:  TBD              Patient currently is not medically stable to d/c.   Difficult to place patient No      Consultants:  None  Procedures:  None   Antimicrobials:  Anti-infectives (From admission, onward)  Start     Dose/Rate Route Frequency Ordered Stop   12/28/20 1000  cefTRIAXone (ROCEPHIN) 1 g in sodium chloride 0.9 % 100 mL IVPB        1 g 200 mL/hr over 30 Minutes Intravenous Daily 12/27/20 1526     12/28/20 0000  cefTRIAXone (ROCEPHIN) 1 g in sodium chloride 0.9 % 100 mL IVPB  Status:  Discontinued        1 g 200 mL/hr over 30 Minutes Intravenous  Once 12/27/20 1523 12/27/20 1526   12/27/20 1445  cefTRIAXone (ROCEPHIN) 1 g in sodium chloride 0.9 % 100 mL IVPB        1 g 200 mL/hr over 30 Minutes Intravenous  Once 12/27/20 1436 12/27/20 1559        Objective: Vitals:   12/28/20 1339 12/28/20 2131  12/29/20 0500 12/29/20 0544  BP: (!) 110/58 (!) 132/93  131/67  Pulse: (!) 52 92  (!) 57  Resp: 16 16  18   Temp: 98.4 F (36.9 C) 98.3 F (36.8 C)    TempSrc: Oral Oral    SpO2: 100% 100%  100%  Weight:   47.3 kg   Height:        Intake/Output Summary (Last 24 hours) at 12/29/2020 1026 Last data filed at 12/29/2020 1000 Gross per 24 hour  Intake 1706.78 ml  Output 450 ml  Net 1256.78 ml    Filed Weights   12/27/20 1335 12/28/20 0500 12/29/20 0500  Weight: 50 kg 47.9 kg 47.3 kg    Examination: General exam: Appears comfortable  Respiratory system: Clear to auscultation. Respiratory effort normal. Cardiovascular system: S1 & S2 heard, RRR. No pedal edema. Gastrointestinal system: Abdomen is nondistended, soft and nontender. Normal bowel sounds heard. Extremities: Symmetric in appearance bilaterally  Skin: No rashes, lesions or ulcers on exposed skin   Data Reviewed: I have personally reviewed following labs and imaging studies  CBC: Recent Labs  Lab 12/27/20 1339 12/27/20 1709 12/28/20 0455  WBC 8.0 7.3 5.3  HGB 11.9* 10.5* 9.2*  HCT 36.0 32.5* 28.3*  MCV 101.1* 102.8* 104.4*  PLT 186 171 149*    Basic Metabolic Panel: Recent Labs  Lab 12/27/20 1339 12/27/20 1709 12/28/20 0455  NA 143  --  140  K 4.1  --  4.0  CL 102  --  105  CO2 31  --  27  GLUCOSE 107*  --  94  BUN 23  --  19  CREATININE 1.02* 0.89 0.75  CALCIUM 9.1  --  8.2*  MG  --  2.2  --   PHOS  --  4.2  --     GFR: Estimated Creatinine Clearance: 48.9 mL/min (by C-G formula based on SCr of 0.75 mg/dL). Liver Function Tests: No results for input(s): AST, ALT, ALKPHOS, BILITOT, PROT, ALBUMIN in the last 168 hours. No results for input(s): LIPASE, AMYLASE in the last 168 hours. No results for input(s): AMMONIA in the last 168 hours. Coagulation Profile: No results for input(s): INR, PROTIME in the last 168 hours. Cardiac Enzymes: No results for input(s): CKTOTAL, CKMB, CKMBINDEX, TROPONINI  in the last 168 hours. BNP (last 3 results) No results for input(s): PROBNP in the last 8760 hours. HbA1C: No results for input(s): HGBA1C in the last 72 hours. CBG: Recent Labs  Lab 12/27/20 1351  GLUCAP 108*    Lipid Profile: No results for input(s): CHOL, HDL, LDLCALC, TRIG, CHOLHDL, LDLDIRECT in the last 72 hours. Thyroid Function Tests: Recent Labs  12/27/20 1709  TSH 1.983    Anemia Panel: Recent Labs    12/27/20 1709  VITAMINB12 498  FOLATE 26.9    Sepsis Labs: Recent Labs  Lab 12/27/20 1357 12/27/20 1709  LATICACIDVEN 1.1 2.0*     Recent Results (from the past 240 hour(s))  Urine Culture     Status: Abnormal (Preliminary result)   Collection Time: 12/27/20  1:48 PM   Specimen: Urine, Clean Catch  Result Value Ref Range Status   Specimen Description   Final    URINE, CLEAN CATCH Performed at [facility], [address], [place], [place] [address]    Special Requests   Final    NONE Performed at [facility], [address] [place], [place] [address]    Culture >=100,000 COLONIES/mL GRAM NEGATIVE RODS (A)  Final   Report Status PENDING  Incomplete  SARS CORONAVIRUS 2 (TAT 6-24 HRS) Nasopharyngeal Nasopharyngeal Swab     Status: None   Collection Time: 12/27/20  3:21 PM   Specimen: Nasopharyngeal Swab  Result Value Ref Range Status   SARS Coronavirus 2 NEGATIVE NEGATIVE Final    Comment: (NOTE) SARS-CoV-2 target nucleic acids are NOT DETECTED.  The SARS-CoV-2 RNA is generally detectable in upper and lower respiratory specimens during the acute phase of infection. Negative results do not preclude SARS-CoV-2 infection, do not rule out co-infections with other pathogens, and should not be used as the sole basis for treatment or other patient management decisions. Negative results must be combined with clinical observations, patient history, and epidemiological information. The expected result is  Negative.  Fact Sheet for Patients: HairSlick.no  Fact Sheet for Healthcare Providers: quierodirigir.com  This test is not yet approved or cleared by the [place] FDA and  has been authorized for detection and/or diagnosis of SARS-CoV-2 by FDA under an Emergency Use Authorization (EUA). This EUA will remain  in effect (meaning this test can be used) for the duration of the COVID-19 declaration under Se ction 564(b)(1) of the Act, 21 U.S.C. section 360bbb-3(b)(1), unless the authorization is terminated or revoked sooner.  Performed at [facility], [address], [place], [place] [address]        Radiology Studies: CT HEAD WO CONTRAST ( )  Result Date: 12/27/2020 CLINICAL DATA:  Delirium EXAM: CT HEAD WITHOUT CONTRAST TECHNIQUE: Contiguous axial images were obtained from the base of the skull through the vertex without intravenous contrast. COMPARISON:  None. FINDINGS: Brain: No evidence of acute infarction, hemorrhage, hydrocephalus, extra-axial collection or mass lesion/mass effect. Mild low-density changes within the periventricular and subcortical white matter compatible with chronic microvascular ischemic change. Mild diffuse cerebral volume loss. Vascular: Atherosclerotic calcifications involving the large vessels of the skull base. No unexpected hyperdense vessel. Skull: Normal. Negative for fracture or focal lesion. Sinuses/Orbits: No acute finding. Other: None. IMPRESSION: 1. No acute intracranial findings. 2. Chronic microvascular ischemic change and cerebral volume loss. Electronically Signed   By: Duanne Guess D.O.   On: 12/27/2020 15:15      Scheduled Meds:  busPIRone  15 mg Oral BID   divalproex  125 mg Oral BID   divalproex  250 mg Oral BID   enoxaparin (LOVENOX) injection  40 mg Subcutaneous Q24H   feeding supplement  1 Container Oral Q24H   feeding supplement  237 mL Oral BID BM   memantine  28  mg Oral Daily   multivitamin with minerals  1 tablet Oral Daily   risperiDONE  1  mg Oral Daily   simvastatin  40 mg Oral q1800   traZODone  100 mg Oral QHS   Continuous Infusions:  sodium chloride 75 mL/hr at 12/29/20 0848   cefTRIAXone (ROCEPHIN)  IV 1 g (12/29/20 0855)     LOS: 2 days      Time spent: 20 minutes   Noralee Stain, DO [facility] 12/29/2020, 10:26 AM   Available via Epic secure chat 7am-7pm After these hours, please refer to coverage provider listed on [email or web address]

## 2020-12-30 LAB — URINE CULTURE: Culture: >=100000 — AB

## 2020-12-30 MED ORDER — CEPHALEXIN 500 MG PO CAPS
500.0000 mg | ORAL_CAPSULE | Freq: Four times a day (QID) | ORAL | Status: DC
  Administered 2020-12-31 – 2021-01-01 (×6): 500 mg via ORAL
  Filled 2020-12-30 (×7): qty 1

## 2020-12-30 NOTE — Progress Notes (Signed)
PROGRESS NOTE    Chelsea Aguirre  GLO:[id number] DOB: 1950-06-15 DOA: 12/27/2020 PCP: Caesar Bookman, NP     Brief Narrative:  Chelsea Aguirre is a 70 year old female with advanced dementia, hyperlipidemia, hypertension who presented to the emergency department due to weakness and decline at home.  Daughter is patient's main caregiver, states that patient has relocated from her home in [place] to [place] so daughter could take care of her.  At baseline, patient is mostly nonverbal, and when she does speak it is largely nonsensical, does not recognize family any longer.  She is dependent in all activities of daily living.  Patient has declined significantly over the past month, now is more lethargic, has not really eaten much in the past week.  It has become increasingly more difficult to take care of her at home.  New events last 24 hours / Subjective: Patient more alert this morning, she is pleasant but confused, unable to answer questions appropriately  Assessment & Plan:   Principal Problem:   UTI (urinary tract infection) Active Problems:   Essential hypertension   Mixed hyperlipidemia   Generalized anxiety disorder   Dementia (HCC)   AKI (acute kidney injury) (HCC)   Macrocytic anemia   Protein-calorie malnutrition, severe   E. coli UTI, present on admission -Urine culture showing pansensitive E. coli -Transition to Keflex  Advanced dementia and poor oral intake History of depression and anxiety -Continue Depakote, Namenda, BuSpar, trazodone, risperidone -Appreciate palliative care medicine  Hyperlipidemia -Continue Lipitor.  Switched from Zocor due to elevated risk for myalgia with combination of Norvasc  HTN -Continue tenormin, norvasc   DVT prophylaxis:  enoxaparin (LOVENOX) injection 30 mg Start: 12/29/20 2200 SCDs Start: 12/27/20 1510  Code Status:     Code Status Orders  (From admission, onward)           Start     Ordered   12/27/20 1550  Do not  attempt resuscitation (DNR)  Continuous       Question Answer Comment  In the event of cardiac or respiratory ARREST Do not call a "code blue"   In the event of cardiac or respiratory ARREST Do not perform Intubation, CPR, defibrillation or ACLS   In the event of cardiac or respiratory ARREST Use medication by any route, position, wound care, and other measures to relive pain and suffering. May use oxygen, suction and manual treatment of airway obstruction as needed for comfort.      12/27/20 1549           Code Status History     Date Active Date Inactive Code Status Order ID Comments User Context   12/27/2020 1510 12/27/2020 1549 Full Code [id number]  Ollen Bowl, MD ED      Advance Directive Documentation    Flowsheet Row Most Recent Value  Type of Advance Directive Healthcare Power of Attorney, Living will  Pre-existing out of facility DNR order (yellow form or pink MOST form) --  "MOST" Form in Place? --      Family Communication: None at bedside  Disposition Plan:  Status is: Inpatient  Remains inpatient appropriate because:Unsafe d/c plan  Dispo: The patient is from: Home              Anticipated d/c is to: SNF              Patient currently is medically stable to d/c.   Difficult to place patient No      Consultants:  Palliative care  medicine  Procedures:  None   Antimicrobials:  Anti-infectives (From admission, onward)    Start     Dose/Rate Route Frequency Ordered Stop   12/30/20 1245  cephALEXin (KEFLEX) capsule 500 mg        500 mg Oral Every 6 hours 12/30/20 1150     12/28/20 1000  cefTRIAXone (ROCEPHIN) 1 g in sodium chloride 0.9 % 100 mL IVPB  Status:  Discontinued        1 g 200 mL/hr over 30 Minutes Intravenous Daily 12/27/20 1526 12/30/20 1150   12/28/20 0000  cefTRIAXone (ROCEPHIN) 1 g in sodium chloride 0.9 % 100 mL IVPB  Status:  Discontinued        1 g 200 mL/hr over 30 Minutes Intravenous  Once 12/27/20 1523 12/27/20 1526    12/27/20 1445  cefTRIAXone (ROCEPHIN) 1 g in sodium chloride 0.9 % 100 mL IVPB        1 g 200 mL/hr over 30 Minutes Intravenous  Once 12/27/20 1436 12/27/20 1559        Objective: Vitals:   12/29/20 0500 12/29/20 0544 12/29/20 1405 12/29/20 2150  BP:  131/67 120/83 115/71  Pulse:  (!) 57 60 78  Resp:  18 18 16   Temp:   98.1 F (36.7 C) 98.1 F (36.7 C)  TempSrc:   Oral Oral  SpO2:  100% 97% 98%  Weight: 47.3 kg     Height:        Intake/Output Summary (Last 24 hours) at 12/30/2020 1151 Last data filed at 12/30/2020 1100 Gross per 24 hour  Intake 300 ml  Output 975 ml  Net -675 ml    Filed Weights   12/27/20 1335 12/28/20 0500 12/29/20 0500  Weight: 50 kg 47.9 kg 47.3 kg    Examination: General exam: Appears calm and comfortable  Respiratory system: Clear to auscultation. Respiratory effort normal. Cardiovascular system: S1 & S2 heard, RRR. No pedal edema. Gastrointestinal system: Abdomen is nondistended, soft and nontender. Normal bowel sounds heard. Central nervous system: Alert  Extremities: Symmetric in appearance bilaterally  Skin: No rashes, lesions or ulcers on exposed skin  Psychiatry: Advanced dementia     Data Reviewed: I have personally reviewed following labs and imaging studies  CBC: Recent Labs  Lab 12/27/20 1339 12/27/20 1709 12/28/20 0455  WBC 8.0 7.3 5.3  HGB 11.9* 10.5* 9.2*  HCT 36.0 32.5* 28.3*  MCV 101.1* 102.8* 104.4*  PLT 186 171 149*    Basic Metabolic Panel: Recent Labs  Lab 12/27/20 1339 12/27/20 1709 12/28/20 0455  NA 143  --  140  K 4.1  --  4.0  CL 102  --  105  CO2 31  --  27  GLUCOSE 107*  --  94  BUN 23  --  19  CREATININE 1.02* 0.89 0.75  CALCIUM 9.1  --  8.2*  MG  --  2.2  --   PHOS  --  4.2  --     GFR: Estimated Creatinine Clearance: 48.9 mL/min (by C-G formula based on SCr of 0.75 mg/dL). Liver Function Tests: No results for input(s): AST, ALT, ALKPHOS, BILITOT, PROT, ALBUMIN in the last 168  hours. No results for input(s): LIPASE, AMYLASE in the last 168 hours. No results for input(s): AMMONIA in the last 168 hours. Coagulation Profile: No results for input(s): INR, PROTIME in the last 168 hours. Cardiac Enzymes: No results for input(s): CKTOTAL, CKMB, CKMBINDEX, TROPONINI in the last 168 hours. BNP (last 3 results) No  results for input(s): PROBNP in the last 8760 hours. HbA1C: No results for input(s): HGBA1C in the last 72 hours. CBG: Recent Labs  Lab 12/27/20 1351  GLUCAP 108*    Lipid Profile: No results for input(s): CHOL, HDL, LDLCALC, TRIG, CHOLHDL, LDLDIRECT in the last 72 hours. Thyroid Function Tests: Recent Labs    12/27/20 1709  TSH 1.983    Anemia Panel: Recent Labs    12/27/20 1709  VITAMINB12 498  FOLATE 26.9    Sepsis Labs: Recent Labs  Lab 12/27/20 1357 12/27/20 1709  LATICACIDVEN 1.1 2.0*     Recent Results (from the past 240 hour(s))  Urine Culture     Status: Abnormal   Collection Time: 12/27/20  1:48 PM   Specimen: Urine, Clean Catch  Result Value Ref Range Status   Specimen Description   Final    URINE, CLEAN CATCH Performed at [facility], [address] [place], [place] [address]    Special Requests   Final    NONE Performed at [facility], [address] [place], [place] [address]    Culture >=100,000 COLONIES/mL ESCHERICHIA COLI (A)  Final   Report Status 12/30/2020 FINAL  Final   Organism ID, Bacteria ESCHERICHIA COLI (A)  Final      Susceptibility   Escherichia coli - MIC*    AMPICILLIN 8 SENSITIVE Sensitive     CEFAZOLIN <=4 SENSITIVE Sensitive     CEFEPIME <=0.12 SENSITIVE Sensitive     CEFTRIAXONE <=0.25 SENSITIVE Sensitive     CIPROFLOXACIN <=0.25 SENSITIVE Sensitive     GENTAMICIN <=1 SENSITIVE Sensitive     IMIPENEM <=0.25 SENSITIVE Sensitive     NITROFURANTOIN <=16 SENSITIVE Sensitive     TRIMETH/SULFA <=20 SENSITIVE Sensitive     AMPICILLIN/SULBACTAM 4  SENSITIVE Sensitive     PIP/TAZO <=4 SENSITIVE Sensitive     * >=100,000 COLONIES/mL ESCHERICHIA COLI  SARS CORONAVIRUS 2 (TAT 6-24 HRS) Nasopharyngeal Nasopharyngeal Swab     Status: None   Collection Time: 12/27/20  3:21 PM   Specimen: Nasopharyngeal Swab  Result Value Ref Range Status   SARS Coronavirus 2 NEGATIVE NEGATIVE Final    Comment: (NOTE) SARS-CoV-2 target nucleic acids are NOT DETECTED.  The SARS-CoV-2 RNA is generally detectable in upper and lower respiratory specimens during the acute phase of infection. Negative results do not preclude SARS-CoV-2 infection, do not rule out co-infections with other pathogens, and should not be used as the sole basis for treatment or other patient management decisions. Negative results must be combined with clinical observations, patient history, and epidemiological information. The expected result is Negative.  Fact Sheet for Patients: HairSlick.nohttps://www.fda.gov/media/138098/download  Fact Sheet for Healthcare Providers: quierodirigir.comhttps://www.fda.gov/media/138095/download  This test is not yet approved or cleared by the [place] FDA and  has been authorized for detection and/or diagnosis of SARS-CoV-2 by FDA under an Emergency Use Authorization (EUA). This EUA will remain  in effect (meaning this test can be used) for the duration of the COVID-19 declaration under Se ction 564(b)(1) of the Act, 21 U.S.C. section 360bbb-3(b)(1), unless the authorization is terminated or revoked sooner.  Performed at [facility], [address] [place], [place] [address]        Radiology Studies: DG CHEST PORT 1 VIEW  Result Date: 12/29/2020 CLINICAL DATA:  New onset cough.  History of dementia, hypertension. EXAM: PORTABLE CHEST 1 VIEW COMPARISON:  None. FINDINGS: Heart size and mediastinal contours are within normal limits. Lungs are clear. No  pleural effusion or pneumothorax is seen. Osseous structures about the chest are unremarkable. Aortic  atherosclerosis. IMPRESSION: 1. No active disease. No evidence of pneumonia or pulmonary edema. 2. Aortic atherosclerosis. Electronically Signed   By: Bary Richard M.D.   On: 12/29/2020 16:05      Scheduled Meds:  amLODipine  5 mg Oral Daily   atenolol  25 mg Oral Daily   atorvastatin  20 mg Oral q1800   busPIRone  15 mg Oral BID   cephALEXin  500 mg Oral Q6H   divalproex  125 mg Oral BID   divalproex  250 mg Oral BID   enoxaparin (LOVENOX) injection  30 mg Subcutaneous Q24H   feeding supplement  1 Container Oral Q24H   feeding supplement  237 mL Oral BID BM   memantine  28 mg Oral Daily   multivitamin with minerals  1 tablet Oral Daily   risperiDONE  1 mg Oral Daily   traZODone  100 mg Oral QHS   Continuous Infusions:     LOS: 3 days      Time spent: 20 minutes   Noralee Stain, DO [facility] 12/30/2020, 11:51 AM   Available via Epic secure chat 7am-7pm After these hours, please refer to coverage provider listed on [email or web address]

## 2020-12-30 NOTE — Evaluation (Signed)
Physical Therapy Evaluation Patient Details Name: Chelsea Aguirre MRN: [id number] DOB: April 18, 1951 Today's Date: 12/30/2020   History of Present Illness  Patient is a 69 year old female presenting with generalized weakness and progressive decline in activity. Patient diagnosed with UTI. PMH significant for dementia, hypertension, hyperlipidemia  Clinical Impression  On eval, pt required Total A +2 for bed mobility 2* lethargy, Mod A +2 to stand and ambulate around the room with 2 HHA. Pt presents with general weakness, decreased activity tolerance, and impaired gait and balance. She does not initiate movement with verbal commands but she does respond to physical guidance. She is pleasant and participatory-again this is with encouragement/physical guidance. Daughter was present during session. Discussed d/c plan-she would like a trial of SNF rehab to see if pt is able to make gains towards her baseline. Recommend trial of rehab at [facility]. Will follow during hospital stay.     Follow Up Recommendations SNF    Equipment Recommendations  None recommended by PT    Recommendations for Other Services       Precautions / Restrictions Precautions Precautions: Fall Precaution Comments: incontinent Restrictions Weight Bearing Restrictions: No      Mobility  Bed Mobility Overal bed mobility: Needs Assistance Bed Mobility: Supine to Sit     Supine to sit: Total assist;+2 for physical assistance;+2 for safety/equipment;HOB elevated     General bed mobility comments: Pt was sleeping soundly. +2 assist to get to EOB to get pt upright and to get her to open her eyes. Min guard mostly but this can fluctuate    Transfers Overall transfer level: Needs assistance Equipment used: 2 person hand held assist Transfers: Sit to/from Stand Sit to Stand: Mod assist;+2 physical assistance;+2 safety/equipment         General transfer comment: Assist to power up, stabilize, control descent. Multimodal  cueing required. Pt responded to guidance. She did not initiate.  Ambulation/Gait Ambulation/Gait assistance: Mod assist;+2 physical assistance;+2 safety/equipment Gait Distance (Feet): 10 Feet Assistive device: 2 person hand held assist Gait Pattern/deviations: Shuffle;Narrow base of support     General Gait Details: Narrow BOS and shuffe gait pattern. Assist to steady and support pt. She did bear her own weight and she tooks steps with forward guidance. She did not initiate.  Stairs            Wheelchair Mobility    Modified Rankin (Stroke Patients Only)       Balance Overall balance assessment: Needs assistance         Standing balance support: Bilateral upper extremity supported Standing balance-Leahy Scale: Poor                               Pertinent Vitals/Pain Pain Assessment: Faces Faces Pain Scale: No hurt    Home Living Family/patient expects to be discharged to:: Unsure Living Arrangements: Children (goes to adult day care at [facility]) Available Help at Discharge: Family Type of Home: House         Home Equipment: None      Prior Function           Comments: per daughter, pt was ambulatory up until recently (beginning to shuffle more). went to adult day care at [facility] during the day.     Hand Dominance        Extremity/Trunk Assessment   Upper Extremity Assessment Upper Extremity Assessment: Defer to OT evaluation    Lower Extremity Assessment Lower  Extremity Assessment: Generalized weakness (able to weightbear)    Cervical / Trunk Assessment Cervical / Trunk Assessment: Kyphotic  Communication   Communication: Expressive difficulties (nonsensical speech often. did respond yes/no to daughters questions)  Cognition Arousal/Alertness: Awake/alert Behavior During Therapy: Flat affect Overall Cognitive Status: History of cognitive impairments - at baseline                                  General Comments: advanced dementia      General Comments      Exercises     Assessment/Plan    PT Assessment Patient needs continued PT services  PT Problem List Decreased strength;Decreased mobility;Decreased activity tolerance;Decreased balance;Decreased knowledge of use of DME;Decreased cognition;Decreased safety awareness       PT Treatment Interventions DME instruction;Gait training;Therapeutic exercise;Balance training;Functional mobility training;Therapeutic activities;Patient/family education    PT Goals (Current goals can be found in the Care Plan section)  Acute Rehab PT Goals Patient Stated Goal: none stated. daughter would like a trial of rehab to see if pt can make any gains toward her baseline PT Goal Formulation: With family Time For Goal Achievement: 01/13/21 Potential to Achieve Goals: Fair    Frequency Min 2X/week   Barriers to discharge        Co-evaluation               AM-PAC PT "6 Clicks" Mobility  Outcome Measure Help needed turning from your back to your side while in a flat bed without using bedrails?: Total Help needed moving from lying on your back to sitting on the side of a flat bed without using bedrails?: Total Help needed moving to and from a bed to a chair (including a wheelchair)?: Total Help needed standing up from a chair using your arms (e.g., wheelchair or bedside chair)?: Total Help needed to walk in hospital room?: Total Help needed climbing 3-5 steps with a railing? : Total 6 Click Score: 6    End of Session Equipment Utilized During Treatment: Gait belt Activity Tolerance: Patient tolerated treatment well Patient left: in chair;with call bell/phone within reach;with family/visitor present;with chair alarm set   PT Visit Diagnosis: Muscle weakness (generalized) (M62.81);Difficulty in walking, not elsewhere classified (R26.2)    Time: 2703-5009 PT Time Calculation (min) (ACUTE ONLY): 19 min   Charges:   PT  Evaluation $PT Eval Moderate Complexity: 1 Mod             Faye Ramsay, PT Acute Rehabilitation  Office: [phone] Pager: [phone]

## 2020-12-30 NOTE — Progress Notes (Signed)
Consultation Note Date: 12/30/2020   Patient Name: Chelsea Aguirre  DOB: 20-Dec-1950  MRN: [id number]  Age / Sex: 70 y.o., female  PCP: Ngetich, Nelda Bucks, NP Referring Physician: Dessa Phi, DO  Reason for Consultation: Establishing goals of care  HPI/Patient Profile: 70 y.o. female  with past medical history of dementia, hyperlipidemia, hypertension admitted on 12/27/2020 with weakness and functional decline who was found to have UTI.  She was not eating or drinking for the past week prior to admission.  It has become more difficult to take care of her at home.  Palliative consulted for goals of care.  Clinical Assessment and Goals of Care: I met today with Chelsea Aguirre and her daughter, Chelsea Aguirre.    I introduced palliative care as specialized medical care for people living with serious illness. It focuses on providing relief from the symptoms and stress of a serious illness. The goal is to improve quality of life for both the patient and the family.  Chelsea Aguirre tells me that her mother moved from [place] to [place] around 10 weeks ago due to progression of dementia that has led to continued changes in her nutrition, cognition, and functional status.  She has been going to [facility] day program, but over the past couple of weeks, both [facility] and Chelsea Aguirre have noted continued changes with progressive decline.  She has gone from being able to bathe and toilet herself to now needing help at times even with feeding.  Chelsea Aguirre has been trying to figure out how to best care for her mother, but she also needs to continue to work full-time and is concerned about her being able to meet her mother's caregiving needs moving forward in her current functional state.  We discussed clinical course during this hospitalization as well as wishes moving forward in regard to advanced directives.  Concepts specific to code status and  rehospitalization discussed.  We discussed difference between a aggressive medical intervention path and a palliative, comfort focused care path.  Earlier this admission, Chelsea Aguirre tells me that she thought that her mother was going to die due to how ill she appeared when UTI had set in.  Today, however, she reports that her mother is the most awake and interactive as she has seen for the past few weeks and she ate her entire meal this morning.  We talked about long-term goals of care and we completed a MOST form today.  We also began discussion about realistic options for when she leaves the hospital.  We discussed options for discharge including: 1) skilled facility for short-term rehab to see how much functional status she can regain (we will need to await PT input regarding appropriateness for rehab as she was too sleepy to participate earlier this admission, however, she was much more awake and interactive during my encounter today than she has been the previous few days.) 2) Home with home health (difficult as family works and her functional status is progressed to the point where it is difficult to leave her alone in her  current state) 3) Home with hospice (currently, she is much more alert and interactive and is now eating and drinking well today per daughter.  With this in mind, her daughter does not feel that hospice would be the right option.  She did think when her mother was sicker that this would be the likely course moving forward and she is open to it in the future when appropriate.  Currently, I do not think she would meet criteria of FAST 7C to qualify for home hospice.) 4) long-term care (may need to work toward pursuing this for the future, but currently she does not have a payer source in place and she does not have resources available to pay for this long-term)  We discussed plan to see if PT can reevaluate to help determine her appropriateness for rehab.  I think it would be open to  trial of rehab if it appears that she is going to be able to regain some functional status.  As she was walking less than 2 weeks ago and did have a much higher baseline at that point, I do think that it would be beneficial to see how much functional status she can regain if she can participate in rehab.  Discussed that I would reach out to transition of care team either tomorrow or Monday to discuss further as well.  Questions and concerns addressed.   PMT will continue to support holistically.  SUMMARY OF RECOMMENDATIONS   -DNR/DNI - We reviewed a MOST form and discussed how to develop plan of care to focus on continuing therapies that would maximize chance of being well enough to return home and limiting therapies not in line with this goal. - We completed MOST form today.  DNR, Limited additional interventions, IVF and ABX if indicated, no feeding tube. - Now that she is more awake, will see if PT can reevaluate to help determine if she has any potential for rehab.  Once we determine this, will discuss further with TOC regarding options for disposition.  Code Status/Advance Care Planning: DNR  Palliative Prophylaxis:  Delirium Protocol  Psycho-social/Spiritual:  Desire for further Chaplaincy support: Not addressed today Additional Recommendations: Caregiving  Support/Resources  Prognosis:  Unable to determine  Discharge Planning: To Be Determined      Primary Diagnoses: Present on Admission:  Essential hypertension  Mixed hyperlipidemia  Dementia (HCC)  Generalized anxiety disorder  UTI (urinary tract infection)  AKI (acute kidney injury) ([place])  Macrocytic anemia   I have reviewed the medical record, interviewed the patient and family, and examined the patient. The following aspects are pertinent.  Past Medical History:  Diagnosis Date   Anxiety    Dementia ([place])    Hyperlipidemia    Hypertension    Social History   Socioeconomic History   Marital status:  Divorced    Spouse name: Not on file   Number of children: Not on file   Years of education: Not on file   Highest education level: Not on file  Occupational History   Not on file  Tobacco Use   Smoking status: Former    Types: Cigarettes   Smokeless tobacco: Never  Vaping Use   Vaping Use: Never used  Substance and Sexual Activity   Alcohol use: Not Currently   Drug use: Never   Sexual activity: Not on file  Other Topics Concern   Not on file  Social History Narrative   Tobacco use, amount per day now: None.   Past tobacco  use, amount per day: Smoker much of life, stopped a few months ago. One pack a day or so.   How many years did you use tobacco: 25 years or 9.   Alcohol use (drinks per week): Socially drank when younger   Diet: Regular    Do you drink/eat things with caffeine: No   Marital status:  Divorced                                What year were you married? Last marriage 76.   Do you live in a house, apartment, assisted living, condo, trailer, etc.? House   Is it one or more stories? 2 stories.    How many persons live in your home? 3   Do you have pets in your home?( please list) Yes 3-Dogs   Highest Level of education completed? High School Diploma.   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   Do you exercise?  Candis Musa a lot                                Type and how often?   Do you have a living will? No   Do you have a DNR form?    No                               If not, do you want to discuss one?   Do you have signed POA/HPOA forms?       Yes                 If so, please bring to you appointment      Do you have any difficulty bathing or dressing yourself? Yes   Do you have any difficulty preparing food or eating? Yes   Do you have any difficulty managing your medications? Yes   Do you have any difficulty managing your finances? Yes    Do you have any difficulty affording your medications? No   Social Determinants of Adult nurse Strain: Not on file  Food Insecurity: Not on file  Transportation Needs: Not on file  Physical Activity: Not on file  Stress: Not on file  Social Connections: Not on file   Family History  Problem Relation Age of Onset   Alzheimer's disease Mother    Dementia Sister    Scheduled Meds:  amLODipine  5 mg Oral Daily   atenolol  25 mg Oral Daily   atorvastatin  20 mg Oral q1800   busPIRone  15 mg Oral BID   divalproex  125 mg Oral BID   divalproex  250 mg Oral BID   enoxaparin (LOVENOX) injection  30 mg Subcutaneous Q24H   feeding supplement  1 Container Oral Q24H   feeding supplement  237 mL Oral BID BM   memantine  28 mg Oral Daily   multivitamin with minerals  1 tablet Oral Daily   risperiDONE  1 mg Oral Daily   traZODone  100 mg Oral QHS   Continuous Infusions:  cefTRIAXone (ROCEPHIN)  IV 1 g (12/29/20 0855)   PRN Meds:.acetaminophen **OR** acetaminophen, ondansetron **OR** ondansetron (ZOFRAN) IV Medications Prior to Admission:  Prior to Admission medications   Medication Sig Start Date End Date Taking? Authorizing Provider  amLODipine (  NORVASC) 5 MG tablet Take 1 tablet (5 mg total) by mouth daily. Patient taking differently: Take 5 mg by mouth in the morning. 11/23/20  Yes Ngetich, Dinah C, NP  atenolol (TENORMIN) 25 MG tablet Take 1 tablet (25 mg total) by mouth at bedtime. 11/23/20  Yes Ngetich, Dinah C, NP  busPIRone (BUSPAR) 15 MG tablet Take 1 tablet (15 mg total) by mouth 2 (two) times daily. Patient taking differently: Take 15 mg by mouth in the morning and at bedtime. 11/23/20  Yes Ngetich, Dinah C, NP  divalproex (DEPAKOTE) 125 MG DR tablet Take 1 tablet (125 mg total) by mouth 2 (two) times daily. 11/23/20  Yes Ngetich, Dinah C, NP  divalproex (DEPAKOTE) 250 MG DR tablet Take 1 tablet (250 mg total) by mouth 2 (two) times daily. 11/23/20  Yes Ngetich, Dinah C, NP  furosemide (LASIX) 40 MG tablet Take 1 tablet (40 mg total) by mouth daily. Patient  taking differently: Take 40 mg by mouth in the morning. 11/23/20  Yes Ngetich, Dinah C, NP  loperamide (IMODIUM A-D) 2 MG tablet Take 2 mg by mouth 4 (four) times daily as needed for diarrhea or loose stools.   Yes [provider]  memantine (NAMENDA XR) 28 MG CP24 24 hr capsule Take 1 capsule (28 mg total) by mouth daily. Patient taking differently: Take 28 mg by mouth at bedtime. 12/21/20  Yes Ngetich, Dinah C, NP  Multiple Vitamins-Minerals (ABC COMPLETE SENIOR WOMENS 50+) TABS Take 1 tablet by mouth daily. 10/24/20  Yes [provider]  potassium chloride SA (KLOR-CON) 20 MEQ tablet Take 1 tablet (20 mEq total) by mouth daily. Patient taking differently: Take 20 mEq by mouth in the morning. 11/23/20  Yes Ngetich, Dinah C, NP  risperiDONE (RISPERDAL) 1 MG tablet Take 1 mg by mouth in the morning and at bedtime. 12/17/20  Yes [provider]  simvastatin (ZOCOR) 40 MG tablet Take 1 tablet (40 mg total) by mouth daily. Patient taking differently: Take 40 mg by mouth at bedtime. 11/23/20  Yes Ngetich, Dinah C, NP  traZODone (DESYREL) 100 MG tablet Take 1 tablet (100 mg total) by mouth at bedtime. 11/23/20  Yes Ngetich, Dinah C, NP  donepezil (ARICEPT) 5 MG tablet Take 1 tablet (5 mg total) by mouth at bedtime. Patient not taking: No sig reported 11/23/20   Ngetich, Dinah C, NP  risperiDONE (RISPERDAL M-TABS) 1 MG disintegrating tablet Take 1 tablet (1 mg total) by mouth 2 (two) times daily. Patient not taking: No sig reported 11/23/20   Ngetich, Dinah C, NP  traZODone (DESYREL) 50 MG tablet Take 0.5 tablets (25 mg total) by mouth 2 (two) times daily. Patient not taking: Reported on 12/27/2020 11/23/20   Ngetich, Nelda Bucks, NP   Allergies  Allergen Reactions   Donepezil Diarrhea and Other (See Comments)    Stopped by MD due to concern for Colitis   Review of Systems  Physical Exam General: Alert, awake, in no acute distress.  Confused with confluent but nonsensical speech  HEENT: No  bruits, no goiter, no JVD Heart: Regular rate and rhythm. No murmur appreciated. Lungs: Good air movement, clear Abdomen: Soft, nontender, nondistended, positive bowel sounds.   Ext: No significant edema Skin: Warm and dry  Vital Signs: BP 115/71 (BP Location: Right Arm)   Pulse 78   Temp 98.1 F (36.7 C) (Oral)   Resp 16   Ht _0  (1.727 m)   Wt 47.3 kg   SpO2 98%  BMI 15.86 kg/m  Pain Scale: 0-10   Pain Score: 0-No pain   SpO2: SpO2: 98 % O2 Device:SpO2: 98 % O2 Flow Rate: .   IO: Intake/output summary:  Intake/Output Summary (Last 24 hours) at 12/30/2020 6184 Last data filed at 12/30/2020 0200 Gross per 24 hour  Intake 744.84 ml  Output 1075 ml  Net -330.16 ml    LBM: Last BM Date:  (UTA) Baseline Weight: Weight: 50 kg Most recent weight: Weight: 47.3 kg     Palliative Assessment/Data:   Flowsheet Rows    Flowsheet Row Most Recent Value  Intake Tab   Referral Department Hospitalist  Unit at Time of Referral Med/Surg Unit  Palliative Care Primary Diagnosis Sepsis/Infectious Disease  Date Notified 12/28/20  Palliative Care Type New Palliative care  Reason for referral Clarify Goals of Care  Date of Admission 12/27/20  Date first seen by Palliative Care 12/29/20  # of days Palliative referral response time 1 Day(s)  # of days IP prior to Palliative referral 1  Clinical Assessment   Palliative Performance Scale Score 40%  Psychosocial & Spiritual Assessment   Palliative Care Outcomes   Patient/Family meeting held? Yes  Who was at the meeting? Daughter       Time In: 51 Time Out: 1720 Time Total:80 Greater than 50%  of this time was spent counseling and coordinating care related to the above assessment and plan.  Signed by: Micheline Rough, MD   Please contact Palliative Medicine Team phone at [phone] for questions and concerns.  For individual provider: See Shea Evans

## 2020-12-30 NOTE — TOC Progression Note (Signed)
Transition of Care [facility]) - Progression Note    Patient Details  Name: Tyeisha Dinan MRN: [id number] Date of Birth: 06-24-1950  [facility] CM/SW Contact  Lennart Pall, LCSW Phone Number: 12/30/2020, 4:05 PM  Clinical Narrative:    Met with pt's daughter at bedside today to discuss dc planning needs.  Daughter also able to meet with Dr. Domingo Cocking of PMT.  Daughter reports that she brought her mother from [place] ~ 10 weeks ago due to increasing issues related to her dementia.  She notes that her mother WAS ambulatory until a few days ago and was able to complete most ADLs with supervision.  She very much hopes to get her mother into SNF for rehab and try to get her back to her functional level as pt was able to attend the [facility] day program 5d/ week.  PT has completed evaluation and recommended SNF as well.  Will begin insurance authorization and determine if this is an option.      Expected Discharge Plan: [place] Barriers to Discharge: Continued Medical Work up, Ship broker  Expected Discharge Plan and Services Expected Discharge Plan: [place] In-house Referral: Clinical Social Work   Post Acute Care Choice: [place] Living arrangements for the past 2 months: Single Family Home                                       Social Determinants of Health (SDOH) Interventions    Readmission Risk Interventions No flowsheet data found.

## 2020-12-31 NOTE — Progress Notes (Signed)
Transition of Care (TOC) -30 day Note       Patient Details  Name: Chelsea Aguirre MRN: [id number] Date of Birth: 1950-06-19    MUST ID: [id number]   To Whom it May Concern:   Please be advised that the above patient will require a short-term nursing home stay, anticipated 30 days or less rehabilitation and strengthening. The plan is for return home.

## 2020-12-31 NOTE — Progress Notes (Signed)
Daily Progress Note   Patient Name: Chelsea Aguirre       Date: 12/31/2020 DOB: 04/16/1951  Age: 70 y.o. MRN#: [id number] Attending Physician: Dessa Phi, DO Primary Care Physician: Sandrea Hughs, NP Admit Date: 12/27/2020  Reason for Consultation/Follow-up: Establishing goals of care  Subjective: I met today with Chelsea Aguirre and her daughter, Chelsea Aguirre.  She was evaluated by PT earlier today and was able to participate in therapy.  [name] and I again discussed that she was ambulatory prior to this admission and she was able to complete most of her ADLs a couple of weeks ago.  With this in mind, she is hopeful that her mother can regain functional status with attempt at rehab.  Length of Stay: 4  Current Medications: Scheduled Meds:   amLODipine  5 mg Oral Daily   atenolol  25 mg Oral Daily   atorvastatin  20 mg Oral q1800   busPIRone  15 mg Oral BID   cephALEXin  500 mg Oral Q6H   divalproex  125 mg Oral BID   divalproex  250 mg Oral BID   enoxaparin (LOVENOX) injection  30 mg Subcutaneous Q24H   feeding supplement  1 Container Oral Q24H   feeding supplement  237 mL Oral BID BM   memantine  28 mg Oral Daily   multivitamin with minerals  1 tablet Oral Daily   risperiDONE  1 mg Oral Daily   traZODone  100 mg Oral QHS    Continuous Infusions:   PRN Meds: acetaminophen **OR** acetaminophen, ondansetron **OR** ondansetron (ZOFRAN) IV  Physical Exam         General: Alert, awake, in no acute distress.  Confused with confluent but nonsensical speech  HEENT: No bruits, no goiter, no JVD Heart: Regular rate and rhythm. No murmur appreciated. Lungs: Good air movement, clear Abdomen: Soft, nontender, nondistended, positive bowel sounds.   Ext: No significant edema Skin: Warm and  dry  Vital Signs: BP (!) 97/52 (BP Location: Right Arm)   Pulse (!) 52   Temp (!) 97.5 F (36.4 C)   Resp 18   Ht $R'5\' 8"'Wc$  (1.727 m)   Wt 47.4 kg   SpO2 98%   BMI 15.89 kg/m  SpO2: SpO2: 98 % O2 Device: O2 Device: Room Air O2 Flow Rate:    Intake/output  summary:  Intake/Output Summary (Last 24 hours) at 12/31/2020 0742 Last data filed at 12/31/2020 0600 Gross per 24 hour  Intake 160 ml  Output 400 ml  Net -240 ml   LBM: Last BM Date:  (UTA) Baseline Weight: Weight: 50 kg Most recent weight: Weight: 47.4 kg       Palliative Assessment/Data:    Flowsheet Rows    Flowsheet Row Most Recent Value  Intake Tab   Referral Department Hospitalist  Unit at Time of Referral Med/Surg Unit  Palliative Care Primary Diagnosis Sepsis/Infectious Disease  Date Notified 12/28/20  Palliative Care Type New Palliative care  Reason for referral Clarify Goals of Care  Date of Admission 12/27/20  Date first seen by Palliative Care 12/29/20  # of days Palliative referral response time 1 Day(s)  # of days IP prior to Palliative referral 1  Clinical Assessment   Palliative Performance Scale Score 40%  Psychosocial & Spiritual Assessment   Palliative Care Outcomes   Patient/Family meeting held? Yes  Who was at the meeting? 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 female  with past medical history of dementia, hyperlipidemia, hypertension admitted on 12/27/2020 with weakness and functional decline who was found to have UTI.  She was not eating or drinking for the past week prior to admission.  It has become more difficult to take care of her at home.  Palliative consulted for goals of care.  Recommendations/Plan: DNR/DNI Continue current care. Plan for skilled facility for trial of rehab.  Recommend palliative care follow at skilled facility.  Code Status:    Code Status Orders  (From admission, onward)           Start     Ordered   12/27/20 1550  Do not attempt resuscitation (DNR)  Continuous       Question Answer Comment  In the event of cardiac or respiratory ARREST Do not call a "code blue"   In the event of cardiac or respiratory ARREST Do not perform Intubation, CPR, defibrillation or ACLS   In the event of cardiac or respiratory ARREST Use medication by any route, position, wound care, and other measures to relive pain and suffering. May use oxygen, suction and manual treatment of airway obstruction as needed for comfort.      12/27/20 1549           Code Status History     Date Active Date Inactive Code Status Order ID Comments User Context   12/27/2020 1510 12/27/2020 1549 Full Code [id number]  Mckinley Jewel, MD ED      Advance Directive Documentation    Flowsheet Row Most Recent Value  Type of Advance Directive Healthcare Power of Attorney, Living will  Pre-existing out of facility DNR order (yellow form or pink MOST form) --  "  MOST" Form in Place? --       Prognosis:  Unable to determine  Discharge Planning: [place] for rehab with Palliative care service follow-up  Care plan was discussed with daughter, TOC  Thank you for allowing the Palliative Medicine Team to assist in the care of this patient.   Total Time 25 Prolonged Time Billed No      Greater than 50%   of this time was spent counseling and coordinating care related to the above assessment and plan.  Micheline Rough, MD  Please contact Palliative Medicine Team phone at [phone] for questions and concerns.

## 2020-12-31 NOTE — NC FL2 (Signed)
[place] MEDICAID FL2 LEVEL OF CARE SCREENING TOOL     IDENTIFICATION  Patient Name: Chelsea Aguirre Birthdate: 05/22/1950 Sex: female Admission Date (Current Location): 12/27/2020  [facility] and [place] Number:  Producer, television/film/video and Address:  [facility],  [address] [place], [place] [address]      Provider Number: [id number]  Attending Physician Name and Address:  Noralee Stain, DO  Relative Name and Phone Number:  [name], [name] [phone]    Current Level of Care: Hospital Recommended Level of Care: Skilled Nursing Facility Prior Approval Number:    Date Approved/Denied:   PASRR Number: Pending  Discharge Plan: SNF    Current Diagnoses: Patient Active Problem List   Diagnosis Date Noted   Protein-calorie malnutrition, severe 12/29/2020   Dementia ([facility])    UTI (urinary tract infection)    AKI (acute kidney injury) ([facility])    Macrocytic anemia    Essential hypertension 11/25/2020   Mixed hyperlipidemia 11/25/2020   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 ([facility]) 11/25/2020   Insomnia due to medical condition 11/25/2020   Generalized anxiety disorder 11/25/2020   Bilateral edema of lower extremity 11/25/2020   Former smoker 11/25/2020   Underweight 11/25/2020   Body mass index (BMI) of 19 or less in adult 11/25/2020    Orientation RESPIRATION BLADDER Height & Weight     Self  Normal Continent Weight: 47.4 kg Height:  5\' 8"  (172.7 cm)  BEHAVIORAL SYMPTOMS/MOOD NEUROLOGICAL BOWEL NUTRITION STATUS      Incontinent Diet (Dysphagia 1)  AMBULATORY STATUS COMMUNICATION OF NEEDS Skin   Extensive Assist Does not communicate Normal                       Personal Care Assistance Level of Assistance  Bathing, Feeding, Dressing Bathing Assistance: Limited assistance Feeding assistance: Limited assistance Dressing Assistance: Limited assistance     Functional Limitations Info  Hearing, Sight, Speech Sight Info: Adequate Hearing  Info: Adequate Speech Info: Adequate    SPECIAL CARE FACTORS FREQUENCY  PT (By licensed PT), OT (By licensed OT)     PT Frequency: 5 x weekly OT Frequency: 5 x weekly            Contractures Contractures Info: Not present    Additional Factors Info  Code Status, Allergies Code Status Info: DNR Allergies Info: Donepezil           Current Medications (12/31/2020):  This is the current hospital active medication list Current Facility-Administered Medications  Medication Dose Route Frequency Provider Last Rate Last Admin   acetaminophen (TYLENOL) tablet 650 mg  650 mg Oral Q6H PRN Pahwani, Rinka R, MD       Or   acetaminophen (TYLENOL) suppository 650 mg  650 mg Rectal Q6H PRN Pahwani, Rinka R, MD       amLODipine (NORVASC) tablet 5 mg  5 mg Oral Daily 01/02/2021, DO   5 mg at 12/30/20 0931   atenolol (TENORMIN) tablet 25 mg  25 mg Oral Daily 01/01/21, DO   25 mg at 12/30/20 0931   atorvastatin (LIPITOR) tablet 20 mg  20 mg Oral q1800 01/01/21, DO   20 mg at 12/30/20 1813   busPIRone (BUSPAR) tablet 15 mg  15 mg Oral BID Pahwani, Rinka R, MD   15 mg at 12/30/20 2222   cephALEXin (KEFLEX) capsule 500 mg  500 mg Oral Q6H 2223, DO       divalproex (DEPAKOTE) DR tablet 125  mg  125 mg Oral BID Pahwani, Rinka R, MD   125 mg at 12/30/20 2221   divalproex (DEPAKOTE) DR tablet 250 mg  250 mg Oral BID Pahwani, Rinka R, MD   250 mg at 12/30/20 2222   enoxaparin (LOVENOX) injection 30 mg  30 mg Subcutaneous Q24H Len Childs T, RPH   30 mg at 12/30/20 2222   feeding supplement (BOOST / RESOURCE BREEZE) liquid 1 Container  1 Container Oral Q24H Noralee Stain, DO   1 Container at 12/30/20 1330   feeding supplement (ENSURE ENLIVE / ENSURE PLUS) liquid 237 mL  237 mL Oral BID BM Noralee Stain, DO   237 mL at 12/30/20 0933   memantine (NAMENDA XR) 24 hr capsule 28 mg  28 mg Oral Daily Pahwani, Rinka R, MD   28 mg at 12/30/20 0930   multivitamin with minerals tablet  1 tablet  1 tablet Oral Daily Noralee Stain, DO   1 tablet at 12/30/20 0929   ondansetron (ZOFRAN) tablet 4 mg  4 mg Oral Q6H PRN Pahwani, Rinka R, MD       Or   ondansetron (ZOFRAN) injection 4 mg  4 mg Intravenous Q6H PRN Pahwani, Rinka R, MD       risperiDONE (RISPERDAL) tablet 1 mg  1 mg Oral Daily Pahwani, Rinka R, MD   1 mg at 12/30/20 0931   traZODone (DESYREL) tablet 100 mg  100 mg Oral QHS Pahwani, Rinka R, MD   100 mg at 12/30/20 2221     Discharge Medications: Please see discharge summary for a list of discharge medications.  Relevant Imaging Results:  Relevant Lab Results:   Additional Information SS# [id number]  Covid vaccine x 3  Davita Sublett, Meriam Sprague, RN

## 2020-12-31 NOTE — Progress Notes (Signed)
PROGRESS NOTE    Chelsea BaldingHazel Aguirre  AOZ:[id number] DOB: 08/06/1950 DOA: 12/27/2020 PCP: Caesar BookmanNgetich, Dinah C, NP     Brief Narrative:  Chelsea BaldingHazel Aguirre is a 70 year old female with advanced dementia, hyperlipidemia, hypertension who presented to the emergency department due to weakness and decline at home.  Daughter is patient's main caregiver, states that patient has relocated [place] in [place] so daughter could take care of her.  At baseline, patient is mostly nonverbal, and when she does speak it is largely nonsensical, does not recognize family any longer.  She is dependent in all activities of daily living.  Patient has declined significantly over the past month, now is more lethargic, has not really eaten much in the past week.  It has become increasingly more difficult to take care of her at home.  Patient was evaluated by physical therapy as well as palliative care medicine.  New events last 24 hours / Subjective: Patient somnolent this morning, has not been awake this morning to take oral medication or breakfast per nursing  Assessment & Plan:   Principal Problem:   UTI (urinary tract infection) Active Problems:   Essential hypertension   Mixed hyperlipidemia   Generalized anxiety disorder   Dementia (HCC)   AKI (acute kidney injury) (HCC)   Macrocytic anemia   Protein-calorie malnutrition, severe   E. coli UTI, present on admission -Urine culture showing pansensitive E. coli -Transition to Keflex  Advanced dementia and poor oral intake History of depression and anxiety -Continue Depakote, Namenda, BuSpar, trazodone, risperidone -Appreciate palliative care medicine  Hyperlipidemia -Continue Lipitor.  Switched from Zocor due to elevated risk for myalgia with combination of Norvasc  HTN -Continue tenormin, norvasc   DVT prophylaxis:  enoxaparin (LOVENOX) injection 30 mg Start: 12/29/20 2200 SCDs Start: 12/27/20 1510  Code Status:     Code Status Orders   (From admission, onward)           Start     Ordered   12/27/20 1550  Do not attempt resuscitation (DNR)  Continuous       Question Answer Comment  In the event of cardiac or respiratory ARREST Do not call a "code blue"   In the event of cardiac or respiratory ARREST Do not perform Intubation, CPR, defibrillation or ACLS   In the event of cardiac or respiratory ARREST Use medication by any route, position, wound care, and other measures to relive pain and suffering. May use oxygen, suction and manual treatment of airway obstruction as needed for comfort.      12/27/20 1549           Code Status History     Date Active Date Inactive Code Status Order ID Comments User Context   12/27/2020 1510 12/27/2020 1549 Full Code [id number]  Ollen BowlPahwani, Rinka R, MD ED      Advance Directive Documentation    Flowsheet Row Most Recent Value  Type of Advance Directive Healthcare Power of Attorney, Living will  Pre-existing out of facility DNR order (yellow form or pink MOST form) --  "MOST" Form in Place? --      Family Communication: None at bedside  Disposition Plan:  Status is: Inpatient  Remains inpatient appropriate because:Unsafe d/c plan  Dispo: The patient is from: Home              Anticipated d/c is to: SNF              Patient currently is medically stable to d/c.  Awaiting SNF placement.   Difficult to place patient No      Consultants:  Palliative care medicine  Procedures:  None   Antimicrobials:  Anti-infectives (From admission, onward)    Start     Dose/Rate Route Frequency Ordered Stop   12/31/20 0600  cephALEXin (KEFLEX) capsule 500 mg        500 mg Oral Every 6 hours 12/30/20 1150     12/28/20 1000  cefTRIAXone (ROCEPHIN) 1 g in sodium chloride 0.9 % 100 mL IVPB  Status:  Discontinued        1 g 200 mL/hr over 30 Minutes Intravenous Daily 12/27/20 1526 12/30/20 1150   12/28/20 0000  cefTRIAXone (ROCEPHIN) 1 g in sodium chloride 0.9 % 100 mL IVPB   Status:  Discontinued        1 g 200 mL/hr over 30 Minutes Intravenous  Once 12/27/20 1523 12/27/20 1526   12/27/20 1445  cefTRIAXone (ROCEPHIN) 1 g in sodium chloride 0.9 % 100 mL IVPB        1 g 200 mL/hr over 30 Minutes Intravenous  Once 12/27/20 1436 12/27/20 1559        Objective: Vitals:   12/30/20 1451 12/30/20 2132 12/31/20 0500 12/31/20 0521  BP: 100/63 112/77  (!) 97/52  Pulse: 93 85  (!) 52  Resp: 18 15  18   Temp: 97.9 F (36.6 C) (!) 97.5 F (36.4 C)    TempSrc: Oral     SpO2: 100% 100%  98%  Weight:   47.4 kg   Height:        Intake/Output Summary (Last 24 hours) at 12/31/2020 1146 Last data filed at 12/31/2020 1000 Gross per 24 hour  Intake 60 ml  Output 550 ml  Net -490 ml    Filed Weights   12/28/20 0500 12/29/20 0500 12/31/20 0500  Weight: 47.9 kg 47.3 kg 47.4 kg    Examination: General exam: Appears calm and comfortable, somnolent Respiratory system: Clear to auscultation. Respiratory effort normal. Cardiovascular system: S1 & S2 heard, RRR. No pedal edema. Gastrointestinal system: Abdomen is nondistended, soft and nontender. Normal bowel sounds heard. Extremities: Symmetric in appearance bilaterally  Skin: No rashes, lesions or ulcers on exposed skin      Data Reviewed: I have personally reviewed following labs and imaging studies  CBC: Recent Labs  Lab 12/27/20 1339 12/27/20 1709 12/28/20 0455  WBC 8.0 7.3 5.3  HGB 11.9* 10.5* 9.2*  HCT 36.0 32.5* 28.3*  MCV 101.1* 102.8* 104.4*  PLT 186 171 149*    Basic Metabolic Panel: Recent Labs  Lab 12/27/20 1339 12/27/20 1709 12/28/20 0455  NA 143  --  140  K 4.1  --  4.0  CL 102  --  105  CO2 31  --  27  GLUCOSE 107*  --  94  BUN 23  --  19  CREATININE 1.02* 0.89 0.75  CALCIUM 9.1  --  8.2*  MG  --  2.2  --   PHOS  --  4.2  --     GFR: Estimated Creatinine Clearance: 49 mL/min (by C-G formula based on SCr of 0.75 mg/dL). Liver Function Tests: No results for input(s): AST,  ALT, ALKPHOS, BILITOT, PROT, ALBUMIN in the last 168 hours. No results for input(s): LIPASE, AMYLASE in the last 168 hours. No results for input(s): AMMONIA in the last 168 hours. Coagulation Profile: No results for input(s): INR, PROTIME in the last 168 hours. Cardiac Enzymes: No results for input(s): CKTOTAL,  CKMB, CKMBINDEX, TROPONINI in the last 168 hours. BNP (last 3 results) No results for input(s): PROBNP in the last 8760 hours. HbA1C: No results for input(s): HGBA1C in the last 72 hours. CBG: Recent Labs  Lab 12/27/20 1351  GLUCAP 108*    Lipid Profile: No results for input(s): CHOL, HDL, LDLCALC, TRIG, CHOLHDL, LDLDIRECT in the last 72 hours. Thyroid Function Tests: No results for input(s): TSH, T4TOTAL, FREET4, T3FREE, THYROIDAB in the last 72 hours.  Anemia Panel: No results for input(s): VITAMINB12, FOLATE, FERRITIN, TIBC, IRON, RETICCTPCT in the last 72 hours.  Sepsis Labs: Recent Labs  Lab 12/27/20 1357 12/27/20 1709  LATICACIDVEN 1.1 2.0*     Recent Results (from the past 240 hour(s))  Urine Culture     Status: Abnormal   Collection Time: 12/27/20  1:48 PM   Specimen: Urine, Clean Catch  Result Value Ref Range Status   Specimen Description   Final    URINE, CLEAN CATCH Performed at [facility], [address], [place], [place] [address]    Special Requests   Final    NONE Performed at [facility], [address], [place], [place] [address]    Culture >=100,000 COLONIES/mL ESCHERICHIA COLI (A)  Final   Report Status 12/30/2020 FINAL  Final   Organism ID, Bacteria ESCHERICHIA COLI (A)  Final      Susceptibility   Escherichia coli - MIC*    AMPICILLIN 8 SENSITIVE Sensitive     CEFAZOLIN <=4 SENSITIVE Sensitive     CEFEPIME <=0.12 SENSITIVE Sensitive     CEFTRIAXONE <=0.25 SENSITIVE Sensitive     CIPROFLOXACIN <=0.25 SENSITIVE Sensitive     GENTAMICIN <=1 SENSITIVE Sensitive     IMIPENEM <=0.25 SENSITIVE  Sensitive     NITROFURANTOIN <=16 SENSITIVE Sensitive     TRIMETH/SULFA <=20 SENSITIVE Sensitive     AMPICILLIN/SULBACTAM 4 SENSITIVE Sensitive     PIP/TAZO <=4 SENSITIVE Sensitive     * >=100,000 COLONIES/mL ESCHERICHIA COLI  SARS CORONAVIRUS 2 (TAT 6-24 HRS) Nasopharyngeal Nasopharyngeal Swab     Status: None   Collection Time: 12/27/20  3:21 PM   Specimen: Nasopharyngeal Swab  Result Value Ref Range Status   SARS Coronavirus 2 NEGATIVE NEGATIVE Final    Comment: (NOTE) SARS-CoV-2 target nucleic acids are NOT DETECTED.  The SARS-CoV-2 RNA is generally detectable in upper and lower respiratory specimens during the acute phase of infection. Negative results do not preclude SARS-CoV-2 infection, do not rule out co-infections with other pathogens, and should not be used as the sole basis for treatment or other patient management decisions. Negative results must be combined with clinical observations, patient history, and epidemiological information. The expected result is Negative.  Fact Sheet for Patients: HairSlick.no  Fact Sheet for Healthcare Providers: quierodirigir.com  This test is not yet approved or cleared by the [place] FDA and  has been authorized for detection and/or diagnosis of SARS-CoV-2 by FDA under an Emergency Use Authorization (EUA). This EUA will remain  in effect (meaning this test can be used) for the duration of the COVID-19 declaration under Se ction 564(b)(1) of the Act, 21 U.S.C. section 360bbb-3(b)(1), unless the authorization is terminated or revoked sooner.  Performed at [facility], [address], [place], [place] [address]        Radiology Studies: DG CHEST PORT 1 VIEW  Result Date: 12/29/2020 CLINICAL DATA:  New onset cough.  History of dementia, hypertension. EXAM: PORTABLE CHEST 1 VIEW COMPARISON:  None. FINDINGS: Heart size and mediastinal contours are within normal  limits. Lungs are clear. No pleural effusion or pneumothorax is seen. Osseous structures about the chest are unremarkable. Aortic atherosclerosis. IMPRESSION: 1. No active disease. No evidence of pneumonia or pulmonary edema. 2. Aortic atherosclerosis. Electronically Signed   By: Bary Richard M.D.   On: 12/29/2020 16:05      Scheduled Meds:  amLODipine  5 mg Oral Daily   atenolol  25 mg Oral Daily   atorvastatin  20 mg Oral q1800   busPIRone  15 mg Oral BID   cephALEXin  500 mg Oral Q6H   divalproex  125 mg Oral BID   divalproex  250 mg Oral BID   enoxaparin (LOVENOX) injection  30 mg Subcutaneous Q24H   feeding supplement  1 Container Oral Q24H   feeding supplement  237 mL Oral BID BM   memantine  28 mg Oral Daily   multivitamin with minerals  1 tablet Oral Daily   risperiDONE  1 mg Oral Daily   traZODone  100 mg Oral QHS   Continuous Infusions:     LOS: 4 days      Time spent: 15 minutes   Noralee Stain, DO [facility] 12/31/2020, 11:46 AM   Available via Epic secure chat 7am-7pm After these hours, please refer to coverage provider listed on [email or web address]

## 2020-12-31 NOTE — TOC Progression Note (Signed)
Transition of Care [facility]) - Progression Note    Patient Details  Name: Chelsea Aguirre MRN: [id number] Date of Birth: 1950/08/30  Transition of [facility]) CM/SW Contact  Joushua Dugar, Meriam Sprague, RN Phone Number: 12/31/2020, 3:20 PM  Clinical Narrative:    Pt FL2 faxed out for SNF placement. [facility] started, with additional clinicals sent as requested by [facility]. SNF auth started with [facility]. PTAR auth started with [facility] as well. Spoke with daughter to give all above information. TOC will continue to follow.   Expected Discharge Plan: Skilled Nursing Facility Barriers to Discharge: Continued Medical Work up, English as a second language teacher  Expected Discharge Plan and Services Expected Discharge Plan: Skilled Nursing Facility In-house Referral: Clinical Social Work   Post Acute Care Choice: Skilled Nursing Facility Living arrangements for the past 2 months: Single Family Home                 Social Determinants of Health (SDOH) Interventions    Readmission Risk Interventions No flowsheet data found.

## 2021-01-01 DIAGNOSIS — I10 Essential (primary) hypertension: Secondary | ICD-10-CM | POA: Diagnosis not present

## 2021-01-01 DIAGNOSIS — R488 Other symbolic dysfunctions: Secondary | ICD-10-CM | POA: Diagnosis not present

## 2021-01-01 DIAGNOSIS — I69828 Other speech and language deficits following other cerebrovascular disease: Secondary | ICD-10-CM | POA: Diagnosis not present

## 2021-01-01 DIAGNOSIS — D649 Anemia, unspecified: Secondary | ICD-10-CM | POA: Diagnosis not present

## 2021-01-01 DIAGNOSIS — N3000 Acute cystitis without hematuria: Secondary | ICD-10-CM | POA: Diagnosis not present

## 2021-01-01 DIAGNOSIS — R531 Weakness: Secondary | ICD-10-CM | POA: Diagnosis not present

## 2021-01-01 DIAGNOSIS — R2681 Unsteadiness on feet: Secondary | ICD-10-CM | POA: Diagnosis not present

## 2021-01-01 DIAGNOSIS — Z7401 Bed confinement status: Secondary | ICD-10-CM | POA: Diagnosis not present

## 2021-01-01 DIAGNOSIS — R4182 Altered mental status, unspecified: Secondary | ICD-10-CM | POA: Diagnosis not present

## 2021-01-01 DIAGNOSIS — R2689 Other abnormalities of gait and mobility: Secondary | ICD-10-CM | POA: Diagnosis not present

## 2021-01-01 DIAGNOSIS — M6281 Muscle weakness (generalized): Secondary | ICD-10-CM | POA: Diagnosis not present

## 2021-01-01 DIAGNOSIS — B9620 Unspecified Escherichia coli [E. coli] as the cause of diseases classified elsewhere: Secondary | ICD-10-CM | POA: Diagnosis not present

## 2021-01-01 DIAGNOSIS — F419 Anxiety disorder, unspecified: Secondary | ICD-10-CM | POA: Diagnosis not present

## 2021-01-01 DIAGNOSIS — K529 Noninfective gastroenteritis and colitis, unspecified: Secondary | ICD-10-CM | POA: Diagnosis not present

## 2021-01-01 DIAGNOSIS — F32A Depression, unspecified: Secondary | ICD-10-CM | POA: Diagnosis not present

## 2021-01-01 DIAGNOSIS — F0390 Unspecified dementia without behavioral disturbance: Secondary | ICD-10-CM | POA: Diagnosis not present

## 2021-01-01 DIAGNOSIS — E43 Unspecified severe protein-calorie malnutrition: Secondary | ICD-10-CM | POA: Diagnosis not present

## 2021-01-01 DIAGNOSIS — I69891 Dysphagia following other cerebrovascular disease: Secondary | ICD-10-CM | POA: Diagnosis not present

## 2021-01-01 DIAGNOSIS — R5381 Other malaise: Secondary | ICD-10-CM | POA: Diagnosis not present

## 2021-01-01 DIAGNOSIS — N390 Urinary tract infection, site not specified: Secondary | ICD-10-CM | POA: Diagnosis not present

## 2021-01-01 DIAGNOSIS — E782 Mixed hyperlipidemia: Secondary | ICD-10-CM | POA: Diagnosis not present

## 2021-01-01 DIAGNOSIS — R1312 Dysphagia, oropharyngeal phase: Secondary | ICD-10-CM | POA: Diagnosis not present

## 2021-01-01 DIAGNOSIS — N179 Acute kidney failure, unspecified: Secondary | ICD-10-CM | POA: Diagnosis not present

## 2021-01-01 DIAGNOSIS — R29898 Other symptoms and signs involving the musculoskeletal system: Secondary | ICD-10-CM | POA: Diagnosis not present

## 2021-01-01 LAB — RESP PANEL BY RT-PCR (FLU A&B, COVID) ARPGX2
Influenza A by PCR: NEGATIVE
Influenza B by PCR: NEGATIVE
SARS Coronavirus 2 by RT PCR: NEGATIVE

## 2021-01-01 MED ORDER — LOPERAMIDE HCL 2 MG PO CAPS
2.0000 mg | ORAL_CAPSULE | ORAL | Status: DC | PRN
  Administered 2021-01-01: 2 mg via ORAL
  Filled 2021-01-01: qty 1

## 2021-01-01 MED ORDER — ATORVASTATIN CALCIUM 20 MG PO TABS: 20.0000 mg | ORAL_TABLET | Freq: Every day | ORAL | 2 refills | Status: AC

## 2021-01-01 NOTE — Progress Notes (Signed)
Daily Progress Note   Patient Name: Chelsea Aguirre       Date: 01/01/2021 DOB: 02/17/1951  Age: 70 y.o. MRN#: [id number] Attending Physician: Noralee Stain, DO Primary Care Physician: Caesar Bookman, NP Admit Date: 12/27/2020  Reason for Consultation/Follow-up: Establishing goals of care  Subjective: I saw and examined Chelsea Aguirre.  Her sister was at the bedside.  She reports that she has been a little sleepy today, but she did wake up and eat lunch.  Sister confirmed that she was ambulatory prior to this admission and she was able to complete most of her ADLs a couple of weeks ago.  With this in mind, she hopes she can regain functional status with attempt at rehab.  Length of Stay: 5  Current Medications: Scheduled Meds:   amLODipine  5 mg Oral Daily   atenolol  25 mg Oral Daily   atorvastatin  20 mg Oral q1800   busPIRone  15 mg Oral BID   cephALEXin  500 mg Oral Q6H   divalproex  125 mg Oral BID   divalproex  250 mg Oral BID   enoxaparin (LOVENOX) injection  30 mg Subcutaneous Q24H   feeding supplement  1 Container Oral Q24H   feeding supplement  237 mL Oral BID BM   memantine  28 mg Oral Daily   multivitamin with minerals  1 tablet Oral Daily   risperiDONE  1 mg Oral Daily   traZODone  100 mg Oral QHS    Continuous Infusions:   PRN Meds: acetaminophen **OR** acetaminophen, ondansetron **OR** ondansetron (ZOFRAN) IV  Physical Exam         General: Alert, awake, in no acute distress.  Confused with confluent but nonsensical speech  HEENT: No bruits, no goiter, no JVD Heart: Regular rate and rhythm. No murmur appreciated. Lungs: Good air movement, clear Abdomen: Soft, nontender, nondistended, positive bowel sounds.   Ext: No significant edema Skin: Warm and  dry  Vital Signs: BP 102/66 (BP Location: Right Arm)   Pulse 81   Temp 97.8 F (36.6 C) (Oral)   Resp 16   Ht 5\' 8"  (1.727 m)   Wt 47.4 kg   SpO2 99%   BMI 15.89 kg/m  SpO2: SpO2: 99 % O2 Device: O2 Device: Room Air O2 Flow Rate:    Intake/output summary:  Intake/Output Summary (Last 24 hours) at 01/01/2021 0555 Last data filed at 01/01/2021 0200 Gross per 24 hour  Intake 90 ml  Output 800 ml  Net -710 ml    LBM: Last BM Date: 12/31/20 Baseline Weight: Weight: 50 kg Most recent weight: Weight: 47.4 kg       Palliative Assessment/Data:    Flowsheet Rows    Flowsheet Row Most Recent Value  Intake Tab   Referral Department Hospitalist  Unit at Time of Referral Med/Surg Unit  Palliative Care Primary Diagnosis Sepsis/Infectious Disease  Date Notified 12/28/20  Palliative Care Type New Palliative care  Reason for referral Clarify Goals of Care  Date of Admission 12/27/20  Date first seen by Palliative Care 12/29/20  # of days Palliative referral response time 1 Day(s)  # of days IP prior to Palliative referral 1  Clinical Assessment   Palliative Performance Scale Score 40%  Psychosocial & Spiritual Assessment   Palliative Care Outcomes   Patient/Family meeting held? Yes  Who was at the meeting? 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 female  with past medical history of dementia, hyperlipidemia, hypertension admitted on 12/27/2020 with weakness and functional decline who was found to have UTI.  She was not eating or drinking for the past week prior to admission.  It has become more difficult to take care of her at home.  Palliative consulted for goals of care.  Recommendations/Plan: DNR/DNI Continue current care. Plan for skilled facility for trial of rehab.  Recommend palliative care follow at skilled facility.  Code Status:    Code Status Orders  (From admission, onward)           Start     Ordered   12/27/20 1550  Do not attempt resuscitation (DNR)  Continuous       Question Answer Comment  In the event of cardiac or respiratory ARREST Do not call a "code blue"   In the event of cardiac or respiratory ARREST Do not perform Intubation, CPR, defibrillation or ACLS   In the event of cardiac or respiratory ARREST Use medication by any route, position, wound care, and other measures to relive pain and suffering. May use oxygen, suction and manual treatment of airway obstruction as needed for comfort.      12/27/20 1549           Code Status History     Date Active Date Inactive Code Status Order ID Comments User Context   12/27/2020 1510 12/27/2020 1549 Full Code [id number]  Ollen Bowl, MD ED      Advance Directive Documentation    Flowsheet Row Most Recent Value  Type of Advance Directive Healthcare Power of Attorney, Living will  Pre-existing out of facility DNR order (yellow form or pink MOST form) --  "MOST"  Form in Place? --       Prognosis:  Unable to determine  Discharge Planning: Skilled Nursing Facility for rehab with Palliative care service follow-up  Care plan was discussed with sister, TOC  Thank you for allowing the Palliative Medicine Team to assist in the care of this patient.   Total Time 20 Prolonged Time Billed No   Greater than 50%  of  this time was spent counseling and coordinating care related to the above assessment and plan.   Romie Minus, MD  Please contact Palliative Medicine Team phone at [phone] for questions and concerns.

## 2021-01-01 NOTE — TOC Progression Note (Addendum)
Transition of Care [facility]) - Progression Note    Patient Details  Name: Chelsea Aguirre MRN: [id number] Date of Birth: 1950/12/26  Transition of Care [facility]) CM/SW Contact  Tomaz Janis, Olegario Messier, RN Phone Number: 01/01/2021, 11:46 AM  Clinical Narrative: Iline Oven for SNF-dtr chose [name]-auth#[id number] to [facility] rep Tammy;PTAR ZSMO#[id number]. Await response from [facility]) rep Lowella Bandy if able to accept for SNF w/PCS.Will need covid once confirmed [facility] has a bed.  12:41p- Dr. Alvino Chapel please order rapid covid-can d/c today to [facility] w/palliative care services.await d/c summary,rm#,nsg tel# for report prior PTAR. 1:54p-Patient can d/c pending covid since swabbed already. Going to [facility] rm#[id number],report tel#[phone].PTAR called. No further CM needs.   Expected Discharge Plan: Skilled Nursing Facility Barriers to Discharge: Continued Medical Work up  Expected Discharge Plan and Services Expected Discharge Plan: Skilled Nursing Facility In-house Referral: Clinical Social Work   Post Acute Care Choice: Skilled Nursing Facility Living arrangements for the past 2 months: Single Family Home                                       Social Determinants of Health (SDOH) Interventions    Readmission Risk Interventions No flowsheet data found.

## 2021-01-01 NOTE — Progress Notes (Signed)
PROGRESS NOTE    Chelsea BaldingHazel Prestage  ZOX:[id number] DOB: 10/29/1950 DOA: 12/27/2020 PCP: Caesar BookmanNgetich, Dinah C, NP     Brief Narrative:  Chelsea Aguirre is a 70 year old female with advanced dementia, hyperlipidemia, hypertension who presented to the emergency department due to weakness and decline at home.  Daughter is patient's main caregiver, states that patient has [place] so daughter could take care of her.  At baseline, patient is mostly nonverbal, and when she does speak it is largely nonsensical, does not recognize family any longer.  She is dependent in all activities of daily living.  Patient has declined significantly over the past month, now is more lethargic, has not really eaten much in the past week.  It has become increasingly more difficult to take care of her at home.  Patient was evaluated by physical therapy as well as palliative care medicine.  New events last 24 hours / Subjective: Patient somnolent this morning, but per daughter patient can be very sleepy at times.   Daughter states that at baseline, patient is ambulatory, her bedroom is upstairs and patient is able to walk down and up the stairs on her own, is able to walk to the car to attend daycare during the day.  At home, patient lives with her sister (who is disabled herself) and sister can only provide minimal assistance.  Daughter works during the day.  Currently, PT note states that patient is total +2 assistance for bed mobility, moderate +2 to stand and ambulate in the room.  Due to patient's decreased activity tolerance, patient is not at her baseline and would benefit from skilled nursing facility for rehab to return back home in order to be able to attend a daycare per her baseline.  Patient would be at high risk for readmission to the hospital if she were to be discharged home should insurance deny SNF placement.  Assessment & Plan:   Principal Problem:   UTI (urinary tract  infection) Active Problems:   Essential hypertension   Mixed hyperlipidemia   Generalized anxiety disorder   Dementia (HCC)   AKI (acute kidney injury) (HCC)   Macrocytic anemia   Protein-calorie malnutrition, severe   E. coli UTI, present on admission -Urine culture showing pansensitive E. coli -Transition to Keflex  Advanced dementia and poor oral intake History of depression and anxiety -Continue Depakote, Namenda, BuSpar, trazodone, risperidone -Appreciate palliative care medicine  Hyperlipidemia -Continue Lipitor.  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  Work-up was completed in [place].  Patient has been having many episodes of diarrhea, has outpatient GI referral set up. -Currently, there is documented episodes of 2 diarrhea yesterday.  Continue to monitor.  Can use Imodium as needed if necessary  DVT prophylaxis:  enoxaparin (LOVENOX) injection 30 mg Start: 12/29/20 2200 SCDs Start: 12/27/20 1510  Code Status:     Code Status Orders  (From admission, onward)           Start     Ordered   12/27/20 1550  Do not attempt resuscitation (DNR)  Continuous       Question Answer Comment  In the event of cardiac or respiratory ARREST Do not call a "code blue"   In the event of cardiac  or respiratory ARREST Do not perform Intubation, CPR, defibrillation or ACLS   In the event of cardiac or respiratory ARREST Use medication by any route, position, wound care, and other measures to relive pain and suffering. May use oxygen, suction and manual treatment of airway obstruction as needed for comfort.      12/27/20 1549           Code Status History     Date Active Date Inactive Code Status Order ID Comments User Context   12/27/2020 1510  12/27/2020 1549 Full Code [id number]  Ollen Bowl, MD ED      Advance Directive Documentation    Flowsheet Row Most Recent Value  Type of Advance Directive Healthcare Power of Attorney, Living will  Pre-existing out of facility DNR order (yellow form or pink MOST form) --  "MOST" Form in Place? --      Family Communication: Updated daughter over the phone  Disposition Plan:  Status is: Inpatient  Remains inpatient appropriate because:Unsafe d/c plan  Dispo: The patient is from: Home              Anticipated d/c is to: SNF              Patient currently is medically stable to d/c.  Awaiting SNF placement.   Difficult to place patient No      Consultants:  Palliative care medicine  Procedures:  None   Antimicrobials:  Anti-infectives (From admission, onward)    Start     Dose/Rate Route Frequency Ordered Stop   12/31/20 0600  cephALEXin (KEFLEX) capsule 500 mg        500 mg Oral Every 6 hours 12/30/20 1150     12/28/20 1000  cefTRIAXone (ROCEPHIN) 1 g in sodium chloride 0.9 % 100 mL IVPB  Status:  Discontinued        1 g 200 mL/hr over 30 Minutes Intravenous Daily 12/27/20 1526 12/30/20 1150   12/28/20 0000  cefTRIAXone (ROCEPHIN) 1 g in sodium chloride 0.9 % 100 mL IVPB  Status:  Discontinued        1 g 200 mL/hr over 30 Minutes Intravenous  Once 12/27/20 1523 12/27/20 1526   12/27/20 1445  cefTRIAXone (ROCEPHIN) 1 g in sodium chloride 0.9 % 100 mL IVPB        1 g 200 mL/hr over 30 Minutes Intravenous  Once 12/27/20 1436 12/27/20 1559        Objective: Vitals:   12/31/20 1439 12/31/20 2158 01/01/21 0500 01/01/21 0611  BP: (!) 99/53 102/66  99/65  Pulse: (!) 54 81  (!) 52  Resp: 18 16  16   Temp: (!) 97.5 F (36.4 C) 97.8 F (36.6 C)  97.7 F (36.5 C)  TempSrc: Oral Oral    SpO2: 99% 99%  98%  Weight:   50.8 kg   Height:        Intake/Output Summary (Last 24 hours) at 01/01/2021 1041 Last data filed at 01/01/2021 0624 Gross per 24 hour  Intake  120 ml  Output 550 ml  Net -430 ml    Filed Weights   12/29/20 0500 12/31/20 0500 01/01/21 0500  Weight: 47.3 kg 47.4 kg 50.8 kg    Examination: General exam: Appears calm and comfortable, somnolent  Respiratory system: Clear to auscultation. Respiratory effort normal. Cardiovascular system: S1 & S2 heard, RRR. No pedal edema. Gastrointestinal system: Abdomen is nondistended, soft and nontender. Normal bowel sounds heard. Extremities: Symmetric in appearance bilaterally  Skin: No  rashes, lesions or ulcers on exposed skin     Data Reviewed: I have personally reviewed following labs and imaging studies  CBC: Recent Labs  Lab 12/27/20 1339 12/27/20 1709 12/28/20 0455  WBC 8.0 7.3 5.3  HGB 11.9* 10.5* 9.2*  HCT 36.0 32.5* 28.3*  MCV 101.1* 102.8* 104.4*  PLT 186 171 149*    Basic Metabolic Panel: Recent Labs  Lab 12/27/20 1339 12/27/20 1709 12/28/20 0455  NA 143  --  140  K 4.1  --  4.0  CL 102  --  105  CO2 31  --  27  GLUCOSE 107*  --  94  BUN 23  --  19  CREATININE 1.02* 0.89 0.75  CALCIUM 9.1  --  8.2*  MG  --  2.2  --   PHOS  --  4.2  --     GFR: Estimated Creatinine Clearance: 52.5 mL/min (by C-G formula based on SCr of 0.75 mg/dL). Liver Function Tests: No results for input(s): AST, ALT, ALKPHOS, BILITOT, PROT, ALBUMIN in the last 168 hours. No results for input(s): LIPASE, AMYLASE in the last 168 hours. No results for input(s): AMMONIA in the last 168 hours. Coagulation Profile: No results for input(s): INR, PROTIME in the last 168 hours. Cardiac Enzymes: No results for input(s): CKTOTAL, CKMB, CKMBINDEX, TROPONINI in the last 168 hours. BNP (last 3 results) No results for input(s): PROBNP in the last 8760 hours. HbA1C: No results for input(s): HGBA1C in the last 72 hours. CBG: Recent Labs  Lab 12/27/20 1351  GLUCAP 108*    Lipid Profile: No results for input(s): CHOL, HDL, LDLCALC, TRIG, CHOLHDL, LDLDIRECT in the last 72 hours. Thyroid  Function Tests: No results for input(s): TSH, T4TOTAL, FREET4, T3FREE, THYROIDAB in the last 72 hours.  Anemia Panel: No results for input(s): VITAMINB12, FOLATE, FERRITIN, TIBC, IRON, RETICCTPCT in the last 72 hours.  Sepsis Labs: Recent Labs  Lab 12/27/20 1357 12/27/20 1709  LATICACIDVEN 1.1 2.0*     Recent Results (from the past 240 hour(s))  Urine Culture     Status: Abnormal   Collection Time: 12/27/20  1:48 PM   Specimen: Urine, Clean Catch  Result Value Ref Range Status   Specimen Description   Final    URINE, CLEAN CATCH Performed at [facility], [address], [place], [place] [address]    Special Requests   Final    NONE Performed at [facility], [address] [place], [place] [address]    Culture >=100,000 COLONIES/mL ESCHERICHIA COLI (A)  Final   Report Status 12/30/2020 FINAL  Final   Organism ID, Bacteria ESCHERICHIA COLI (A)  Final      Susceptibility   Escherichia coli - MIC*    AMPICILLIN 8 SENSITIVE Sensitive     CEFAZOLIN <=4 SENSITIVE Sensitive     CEFEPIME <=0.12 SENSITIVE Sensitive     CEFTRIAXONE <=0.25 SENSITIVE Sensitive     CIPROFLOXACIN <=0.25 SENSITIVE Sensitive     GENTAMICIN <=1 SENSITIVE Sensitive     IMIPENEM <=0.25 SENSITIVE Sensitive     NITROFURANTOIN <=16 SENSITIVE Sensitive     TRIMETH/SULFA <=20 SENSITIVE Sensitive     AMPICILLIN/SULBACTAM 4 SENSITIVE Sensitive     PIP/TAZO <=4 SENSITIVE Sensitive     * >=100,000 COLONIES/mL ESCHERICHIA COLI  SARS CORONAVIRUS 2 (TAT 6-24 HRS) Nasopharyngeal Nasopharyngeal Swab     Status: None   Collection Time: 12/27/20  3:21 PM   Specimen: Nasopharyngeal Swab  Result Value Ref Range  Status   SARS Coronavirus 2 NEGATIVE NEGATIVE Final    Comment: (NOTE) SARS-CoV-2 target nucleic acids are NOT DETECTED.  The SARS-CoV-2 RNA is generally detectable in upper and lower respiratory specimens during the acute phase of infection. Negative results do not  preclude SARS-CoV-2 infection, do not rule out co-infections with other pathogens, and should not be used as the sole basis for treatment or other patient management decisions. Negative results must be combined with clinical observations, patient history, and epidemiological information. The expected result is Negative.  Fact Sheet for Patients: HairSlick.no  Fact Sheet for Healthcare Providers: quierodirigir.com  This test is not yet approved or cleared by the [place] FDA and  has been authorized for detection and/or diagnosis of SARS-CoV-2 by FDA under an Emergency Use Authorization (EUA). This EUA will remain  in effect (meaning this test can be used) for the duration of the COVID-19 declaration under Se ction 564(b)(1) of the Act, 21 U.S.C. section 360bbb-3(b)(1), unless the authorization is terminated or revoked sooner.  Performed at [facility], [address], [place], [place] [address]        Radiology Studies: No results found.    Scheduled Meds:  amLODipine  5 mg Oral Daily   atenolol  25 mg Oral Daily   atorvastatin  20 mg Oral q1800   busPIRone  15 mg Oral BID   cephALEXin  500 mg Oral Q6H   divalproex  125 mg Oral BID   divalproex  250 mg Oral BID   enoxaparin (LOVENOX) injection  30 mg Subcutaneous Q24H   feeding supplement  1 Container Oral Q24H   feeding supplement  237 mL Oral BID BM   memantine  28 mg Oral Daily   multivitamin with minerals  1 tablet Oral Daily   risperiDONE  1 mg Oral Daily   traZODone  100 mg Oral QHS   Continuous Infusions:     LOS: 5 days      Time spent: 25 minutes   Noralee Stain, DO [facility] 01/01/2021, 10:41 AM   Available via Epic secure chat 7am-7pm After these hours, please refer to coverage provider listed on [email or web address]

## 2021-01-01 NOTE — Progress Notes (Signed)
2nd attempt to call report, no answer.

## 2021-01-01 NOTE — Progress Notes (Signed)
First attempt to call report to [facility], no answer.

## 2021-01-01 NOTE — Discharge Summary (Signed)
Physician Discharge Summary  Chelsea Aguirre ONG:[id number] DOB: 12/06/1950 DOA: 12/27/2020  PCP: Caesar BookmanNgetich, Dinah C, NP  Admit date: 12/27/2020 Discharge date: 01/01/2021  Admitted From: Home Disposition:  SNF  Recommendations for Outpatient Follow-up:  Follow up with PCP in 1 week Follow up with outpatient palliative care  Follow up with outpatient GI as referred by primary care  Discharge Condition: Stable CODE STATUS: DNR  Diet recommendation:  Diet Orders (From admission, onward)     Start     Ordered   12/28/20 1505  DIET - DYS 1 Room service appropriate? Yes; Fluid consistency: Thin  Diet effective now       Question Answer Comment  Room service appropriate? Yes   Fluid consistency: Thin      12/28/20 1505           Brief/Interim Summary: Chelsea BaldingHazel Kulish is a 70 year old female with advanced dementia, hyperlipidemia, hypertension who presented to the emergency department due to weakness and decline at home.  Daughter is patient's main caregiver, states that patient has relocated from [place] in [place] so daughter could take care of her.  At baseline, patient is mostly nonverbal, and when she does speak it is largely nonsensical, does not recognize family any longer.  She is dependent in all activities of daily living.  Patient has declined significantly over the past month, now is more lethargic, has not really eaten much in the past week.  It has become increasingly more difficult to take care of her at home.  Patient was evaluated by physical therapy as well as palliative care medicine. Daughter states that at baseline, patient is ambulatory, her bedroom is upstairs and patient is able to walk down and up the stairs on her own, is able to walk to the car to attend daycare during the day.    Discharge Diagnoses:  Principal Problem:   UTI (urinary tract infection) Active Problems:   Essential hypertension   Mixed hyperlipidemia   Generalized anxiety disorder    Dementia (HCC)   AKI (acute kidney injury) (HCC)   Macrocytic anemia   Protein-calorie malnutrition, severe   E. coli UTI, present on admission -Urine culture showing pansensitive E. coli -Completed antibiotics   Advanced dementia and poor oral intake History of depression and anxiety -Continue Depakote, Namenda, BuSpar, trazodone, risperidone -Appreciate palliative care medicine   Hyperlipidemia -Continue Lipitor.  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  Work-up was completed in [place].  Patient has been having many episodes of diarrhea, has outpatient GI referral set up. -Currently, there is documented episodes of 2 diarrhea yesterday.  Continue to monitor.  Can use Imodium as needed if necessary  Discharge Instructions  Discharge Instructions     Increase activity slowly   Complete by: As directed       Allergies as of 01/01/2021       Reactions   Donepezil Diarrhea, Other (See Comments)   Stopped by MD due to concern for Colitis        Medication List     STOP taking these medications    donepezil 5 MG tablet Commonly known as: ARICEPT   simvastatin 40 MG tablet Commonly known as: ZOCOR       TAKE these medications  ABC Complete Senior Womens 50+ Tabs Take 1 tablet by mouth daily.   amLODipine 5 MG tablet Commonly known as: NORVASC Take 1 tablet (5 mg total) by mouth daily. What changed: when to take this   atenolol 25 MG tablet Commonly known as: TENORMIN Take 1 tablet (25 mg total) by mouth at bedtime.   atorvastatin 20 MG tablet Commonly known as: LIPITOR Take 1 tablet (20 mg total) by mouth daily at 6 PM.   busPIRone 15 MG tablet Commonly known as: BUSPAR Take 1 tablet (15 mg total) by mouth 2 (two) times  daily. What changed: when to take this   divalproex 125 MG DR tablet Commonly known as: DEPAKOTE Take 1 tablet (125 mg total) by mouth 2 (two) times daily.   divalproex 250 MG DR tablet Commonly known as: DEPAKOTE Take 1 tablet (250 mg total) by mouth 2 (two) times daily.   furosemide 40 MG tablet Commonly known as: LASIX Take 1 tablet (40 mg total) by mouth daily. What changed: when to take this   loperamide 2 MG tablet Commonly known as: IMODIUM A-D Take 2 mg by mouth 4 (four) times daily as needed for diarrhea or loose stools.   memantine 28 MG Cp24 24 hr capsule Commonly known as: Namenda XR Take 1 capsule (28 mg total) by mouth daily. What changed: when to take this   potassium chloride SA 20 MEQ tablet Commonly known as: KLOR-CON Take 1 tablet (20 mEq total) by mouth daily. What changed: when to take this   risperiDONE 1 MG tablet Commonly known as: RISPERDAL Take 1 mg by mouth in the morning and at bedtime. What changed: Another medication with the same name was removed. Continue taking this medication, and follow the directions you see here.   traZODone 100 MG tablet Commonly known as: DESYREL Take 1 tablet (100 mg total) by mouth at bedtime. What changed: Another medication with the same name was removed. Continue taking this medication, and follow the directions you see here.        Follow-up Information     Ngetich, Dinah C, NP Follow up.   Specialty: Family Medicine Why: Follow up with GI referral Contact information: [address] [place] [address] [phone]                Allergies  Allergen Reactions   Donepezil Diarrhea and Other (See Comments)    Stopped by MD due to concern for Colitis    Consultations: Palliative care    Procedures/Studies: CT HEAD WO CONTRAST ( )  Result Date: 12/27/2020 CLINICAL DATA:  Delirium EXAM: CT HEAD WITHOUT CONTRAST TECHNIQUE: Contiguous axial images were obtained from the base of the  skull through the vertex without intravenous contrast. COMPARISON:  None. FINDINGS: Brain: No evidence of acute infarction, hemorrhage, hydrocephalus, extra-axial collection or mass lesion/mass effect. Mild low-density changes within the periventricular and subcortical white matter compatible with chronic microvascular ischemic change. Mild diffuse cerebral volume loss. Vascular: Atherosclerotic calcifications involving the large vessels of the skull base. No unexpected hyperdense vessel. Skull: Normal. Negative for fracture or focal lesion. Sinuses/Orbits: No acute finding. Other: None. IMPRESSION: 1. No acute intracranial findings. 2. Chronic microvascular ischemic change and cerebral volume loss. Electronically Signed   By: Duanne Guess D.O.   On: 12/27/2020 15:15   DG CHEST PORT 1 VIEW  Result Date: 12/29/2020 CLINICAL DATA:  New onset cough.  History of dementia, hypertension. EXAM: PORTABLE CHEST 1 VIEW COMPARISON:  None. FINDINGS: Heart size  and mediastinal contours are within normal limits. Lungs are clear. No pleural effusion or pneumothorax is seen. Osseous structures about the chest are unremarkable. Aortic atherosclerosis. IMPRESSION: 1. No active disease. No evidence of pneumonia or pulmonary edema. 2. Aortic atherosclerosis. Electronically Signed   By: Bary Richard M.D.   On: 12/29/2020 16:05       Discharge Exam: Vitals:   01/01/21 0611 01/01/21 1205  BP: 99/65 (!) 164/95  Pulse: (!) 52 79  Resp: 16   Temp: 97.7 F (36.5 C)   SpO2: 98% 96%    Examination: General exam: Appears calm and comfortable, somnolent  Respiratory system: Clear to auscultation. Respiratory effort normal. Cardiovascular system: S1 & S2 heard, RRR. No pedal edema. Gastrointestinal system: Abdomen is nondistended, soft and nontender. Normal bowel sounds heard. Extremities: Symmetric in appearance bilaterally  Skin: No rashes, lesions or ulcers on exposed skin     The results of significant  diagnostics from this hospitalization (including imaging, microbiology, ancillary and laboratory) are listed below for reference.     Microbiology: Recent Results (from the past 240 hour(s))  Urine Culture     Status: Abnormal   Collection Time: 12/27/20  1:48 PM   Specimen: Urine, Clean Catch  Result Value Ref Range Status   Specimen Description   Final    URINE, CLEAN CATCH Performed at [facility], [address], [place], [place] [address]    Special Requests   Final    NONE Performed at [facility], [address], [place], [place] [address]    Culture >=100,000 COLONIES/mL ESCHERICHIA COLI (A)  Final   Report Status 12/30/2020 FINAL  Final   Organism ID, Bacteria ESCHERICHIA COLI (A)  Final      Susceptibility   Escherichia coli - MIC*    AMPICILLIN 8 SENSITIVE Sensitive     CEFAZOLIN <=4 SENSITIVE Sensitive     CEFEPIME <=0.12 SENSITIVE Sensitive     CEFTRIAXONE <=0.25 SENSITIVE Sensitive     CIPROFLOXACIN <=0.25 SENSITIVE Sensitive     GENTAMICIN <=1 SENSITIVE Sensitive     IMIPENEM <=0.25 SENSITIVE Sensitive     NITROFURANTOIN <=16 SENSITIVE Sensitive     TRIMETH/SULFA <=20 SENSITIVE Sensitive     AMPICILLIN/SULBACTAM 4 SENSITIVE Sensitive     PIP/TAZO <=4 SENSITIVE Sensitive     * >=100,000 COLONIES/mL ESCHERICHIA COLI  SARS CORONAVIRUS 2 (TAT 6-24 HRS) Nasopharyngeal Nasopharyngeal Swab     Status: None   Collection Time: 12/27/20  3:21 PM   Specimen: Nasopharyngeal Swab  Result Value Ref Range Status   SARS Coronavirus 2 NEGATIVE NEGATIVE Final    Comment: (NOTE) SARS-CoV-2 target nucleic acids are NOT DETECTED.  The SARS-CoV-2 RNA is generally detectable in upper and lower respiratory specimens during the acute phase of infection. Negative results do not preclude SARS-CoV-2 infection, do not rule out co-infections with other pathogens, and should not be used as the sole basis for treatment or other patient management  decisions. Negative results must be combined with clinical observations, patient history, and epidemiological information. The expected result is Negative.  Fact Sheet for Patients: HairSlick.no  Fact Sheet for Healthcare Providers: quierodirigir.com  This test is not yet approved or cleared by the [place] FDA and  has been authorized for detection and/or diagnosis of SARS-CoV-2 by FDA under an Emergency Use Authorization (EUA). This EUA will remain  in effect (meaning this test can be used) for the duration of the COVID-19 declaration under Se ction 564(b)(1) of  the Act, 21 U.S.C. section 360bbb-3(b)(1), unless the authorization is terminated or revoked sooner.  Performed at [facility], [address] [place], [place] [address]      Labs: BNP (last 3 results) No results for input(s): BNP in the last 8760 hours. Basic Metabolic Panel: Recent Labs  Lab 12/27/20 1339 12/27/20 1709 12/28/20 0455  NA 143  --  140  K 4.1  --  4.0  CL 102  --  105  CO2 31  --  27  GLUCOSE 107*  --  94  BUN 23  --  19  CREATININE 1.02* 0.89 0.75  CALCIUM 9.1  --  8.2*  MG  --  2.2  --   PHOS  --  4.2  --    Liver Function Tests: No results for input(s): AST, ALT, ALKPHOS, BILITOT, PROT, ALBUMIN in the last 168 hours. No results for input(s): LIPASE, AMYLASE in the last 168 hours. No results for input(s): AMMONIA in the last 168 hours. CBC: Recent Labs  Lab 12/27/20 1339 12/27/20 1709 12/28/20 0455  WBC 8.0 7.3 5.3  HGB 11.9* 10.5* 9.2*  HCT 36.0 32.5* 28.3*  MCV 101.1* 102.8* 104.4*  PLT 186 171 149*   Cardiac Enzymes: No results for input(s): CKTOTAL, CKMB, CKMBINDEX, TROPONINI in the last 168 hours. BNP: Invalid input(s): POCBNP CBG: Recent Labs  Lab 12/27/20 1351  GLUCAP 108*   D-Dimer No results for input(s): DDIMER in the last 72 hours. Hgb A1c No results for input(s): HGBA1C in the last 72  hours. Lipid Profile No results for input(s): CHOL, HDL, LDLCALC, TRIG, CHOLHDL, LDLDIRECT in the last 72 hours. Thyroid function studies No results for input(s): TSH, T4TOTAL, T3FREE, THYROIDAB in the last 72 hours.  Invalid input(s): FREET3 Anemia work up No results for input(s): VITAMINB12, FOLATE, FERRITIN, TIBC, IRON, RETICCTPCT in the last 72 hours. Urinalysis    Component Value Date/Time   COLORURINE YELLOW 12/27/2020 1348   APPEARANCEUR CLOUDY (A) 12/27/2020 1348   LABSPEC 1.008 12/27/2020 1348   PHURINE 6.0 12/27/2020 1348   GLUCOSEU NEGATIVE 12/27/2020 1348   HGBUR NEGATIVE 12/27/2020 1348   BILIRUBINUR NEGATIVE 12/27/2020 1348   KETONESUR 5 (A) 12/27/2020 1348   PROTEINUR NEGATIVE 12/27/2020 1348   NITRITE NEGATIVE 12/27/2020 1348   LEUKOCYTESUR LARGE (A) 12/27/2020 1348   Sepsis Labs Invalid input(s): PROCALCITONIN,  WBC,  LACTICIDVEN Microbiology Recent Results (from the past 240 hour(s))  Urine Culture     Status: Abnormal   Collection Time: 12/27/20  1:48 PM   Specimen: Urine, Clean Catch  Result Value Ref Range Status   Specimen Description   Final    URINE, CLEAN CATCH Performed at [facility], [address], [place], [place] [address]    Special Requests   Final    NONE Performed at [facility], [address] [place], [place] [address]    Culture >=100,000 COLONIES/mL ESCHERICHIA COLI (A)  Final   Report Status 12/30/2020 FINAL  Final   Organism ID, Bacteria ESCHERICHIA COLI (A)  Final      Susceptibility   Escherichia coli - MIC*    AMPICILLIN 8 SENSITIVE Sensitive     CEFAZOLIN <=4 SENSITIVE Sensitive     CEFEPIME <=0.12 SENSITIVE Sensitive     CEFTRIAXONE <=0.25 SENSITIVE Sensitive     CIPROFLOXACIN <=0.25 SENSITIVE Sensitive     GENTAMICIN <=1 SENSITIVE Sensitive     IMIPENEM <=0.25 SENSITIVE Sensitive     NITROFURANTOIN <=16 SENSITIVE  Sensitive     TRIMETH/SULFA <=20 SENSITIVE Sensitive      AMPICILLIN/SULBACTAM 4 SENSITIVE Sensitive     PIP/TAZO <=4 SENSITIVE Sensitive     * >=100,000 COLONIES/mL ESCHERICHIA COLI  SARS CORONAVIRUS 2 (TAT 6-24 HRS) Nasopharyngeal Nasopharyngeal Swab     Status: None   Collection Time: 12/27/20  3:21 PM   Specimen: Nasopharyngeal Swab  Result Value Ref Range Status   SARS Coronavirus 2 NEGATIVE NEGATIVE Final    Comment: (NOTE) SARS-CoV-2 target nucleic acids are NOT DETECTED.  The SARS-CoV-2 RNA is generally detectable in upper and lower respiratory specimens during the acute phase of infection. Negative results do not preclude SARS-CoV-2 infection, do not rule out co-infections with other pathogens, and should not be used as the sole basis for treatment or other patient management decisions. Negative results must be combined with clinical observations, patient history, and epidemiological information. The expected result is Negative.  Fact Sheet for Patients: HairSlick.no  Fact Sheet for Healthcare Providers: quierodirigir.com  This test is not yet approved or cleared by the [place] FDA and  has been authorized for detection and/or diagnosis of SARS-CoV-2 by FDA under an Emergency Use Authorization (EUA). This EUA will remain  in effect (meaning this test can be used) for the duration of the COVID-19 declaration under Se ction 564(b)(1) of the Act, 21 U.S.C. section 360bbb-3(b)(1), unless the authorization is terminated or revoked sooner.  Performed at [facility], [address], [place], [place] [address]      Patient was seen and examined on the day of discharge and was found to be in stable condition. Time coordinating discharge: 35 minutes including assessment and coordination of care, as well as examination of the patient.   SIGNED:  Noralee Stain, DO [facility] 01/01/2021, 12:45 PM

## 2021-01-02 DIAGNOSIS — I10 Essential (primary) hypertension: Secondary | ICD-10-CM | POA: Diagnosis not present

## 2021-01-02 DIAGNOSIS — F0390 Unspecified dementia without behavioral disturbance: Secondary | ICD-10-CM | POA: Diagnosis not present

## 2021-01-02 DIAGNOSIS — R5381 Other malaise: Secondary | ICD-10-CM | POA: Diagnosis not present

## 2021-01-02 DIAGNOSIS — E782 Mixed hyperlipidemia: Secondary | ICD-10-CM | POA: Diagnosis not present

## 2021-01-04 ENCOUNTER — Other Ambulatory Visit: Payer: Self-pay | Admitting: Family

## 2021-01-04 DIAGNOSIS — F02818 Dementia in other diseases classified elsewhere, unspecified severity, with other behavioral disturbance: Secondary | ICD-10-CM

## 2021-01-04 DIAGNOSIS — F0281 Dementia in other diseases classified elsewhere with behavioral disturbance: Secondary | ICD-10-CM

## 2021-01-04 DIAGNOSIS — R600 Localized edema: Secondary | ICD-10-CM

## 2021-01-04 DIAGNOSIS — G300 Alzheimer's disease with early onset: Secondary | ICD-10-CM

## 2021-01-07 ENCOUNTER — Other Ambulatory Visit: Payer: Self-pay | Admitting: Family

## 2021-01-07 NOTE — Telephone Encounter (Signed)
Patient has request for medication "Risperidone 1mg " to be refilled. Patient medication hasnt been filled by [facility] provider. Medication pend and sent to PCP Ngetich, [facility], NP for approval. Please Advise.

## 2021-01-08 DIAGNOSIS — N179 Acute kidney failure, unspecified: Secondary | ICD-10-CM | POA: Diagnosis not present

## 2021-01-10 DIAGNOSIS — N390 Urinary tract infection, site not specified: Secondary | ICD-10-CM | POA: Diagnosis not present

## 2021-01-10 DIAGNOSIS — F0390 Unspecified dementia without behavioral disturbance: Secondary | ICD-10-CM | POA: Diagnosis not present

## 2021-01-10 DIAGNOSIS — E43 Unspecified severe protein-calorie malnutrition: Secondary | ICD-10-CM | POA: Diagnosis not present

## 2021-01-10 DIAGNOSIS — F32A Depression, unspecified: Secondary | ICD-10-CM | POA: Diagnosis not present

## 2021-01-10 DIAGNOSIS — R5381 Other malaise: Secondary | ICD-10-CM | POA: Diagnosis not present

## 2021-01-10 DIAGNOSIS — N179 Acute kidney failure, unspecified: Secondary | ICD-10-CM | POA: Diagnosis not present

## 2021-01-17 ENCOUNTER — Emergency Department (HOSPITAL_COMMUNITY): Payer: PPO

## 2021-01-17 ENCOUNTER — Other Ambulatory Visit: Payer: Self-pay

## 2021-01-17 ENCOUNTER — Encounter (HOSPITAL_COMMUNITY): Payer: Self-pay

## 2021-01-17 ENCOUNTER — Inpatient Hospital Stay (HOSPITAL_COMMUNITY)
Admission: EM | Admit: 2021-01-17 | Discharge: 2021-02-16 | DRG: 951 | Disposition: E | Payer: PPO | Source: Skilled Nursing Facility | Attending: Family Medicine | Admitting: Family Medicine

## 2021-01-17 DIAGNOSIS — N390 Urinary tract infection, site not specified: Secondary | ICD-10-CM | POA: Diagnosis not present

## 2021-01-17 DIAGNOSIS — J690 Pneumonitis due to inhalation of food and vomit: Secondary | ICD-10-CM | POA: Diagnosis present

## 2021-01-17 DIAGNOSIS — G300 Alzheimer's disease with early onset: Secondary | ICD-10-CM | POA: Diagnosis not present

## 2021-01-17 DIAGNOSIS — J9691 Respiratory failure, unspecified with hypoxia: Secondary | ICD-10-CM | POA: Diagnosis not present

## 2021-01-17 DIAGNOSIS — Z789 Other specified health status: Secondary | ICD-10-CM

## 2021-01-17 DIAGNOSIS — R000 Tachycardia, unspecified: Secondary | ICD-10-CM | POA: Diagnosis not present

## 2021-01-17 DIAGNOSIS — Z820 Family history of epilepsy and other diseases of the nervous system: Secondary | ICD-10-CM | POA: Diagnosis not present

## 2021-01-17 DIAGNOSIS — E43 Unspecified severe protein-calorie malnutrition: Secondary | ICD-10-CM | POA: Diagnosis not present

## 2021-01-17 DIAGNOSIS — L8990 Pressure ulcer of unspecified site, unspecified stage: Secondary | ICD-10-CM | POA: Insufficient documentation

## 2021-01-17 DIAGNOSIS — F0390 Unspecified dementia without behavioral disturbance: Secondary | ICD-10-CM | POA: Diagnosis present

## 2021-01-17 DIAGNOSIS — E785 Hyperlipidemia, unspecified: Secondary | ICD-10-CM | POA: Diagnosis present

## 2021-01-17 DIAGNOSIS — I69391 Dysphagia following cerebral infarction: Secondary | ICD-10-CM

## 2021-01-17 DIAGNOSIS — Z87891 Personal history of nicotine dependence: Secondary | ICD-10-CM

## 2021-01-17 DIAGNOSIS — Z681 Body mass index (BMI) 19 or less, adult: Secondary | ICD-10-CM

## 2021-01-17 DIAGNOSIS — R1310 Dysphagia, unspecified: Secondary | ICD-10-CM | POA: Diagnosis present

## 2021-01-17 DIAGNOSIS — E872 Acidosis: Secondary | ICD-10-CM | POA: Diagnosis not present

## 2021-01-17 DIAGNOSIS — R0689 Other abnormalities of breathing: Secondary | ICD-10-CM | POA: Diagnosis not present

## 2021-01-17 DIAGNOSIS — R6521 Severe sepsis with septic shock: Secondary | ICD-10-CM | POA: Diagnosis present

## 2021-01-17 DIAGNOSIS — I712 Thoracic aortic aneurysm, without rupture: Secondary | ICD-10-CM | POA: Diagnosis not present

## 2021-01-17 DIAGNOSIS — F419 Anxiety disorder, unspecified: Secondary | ICD-10-CM | POA: Diagnosis not present

## 2021-01-17 DIAGNOSIS — Z515 Encounter for palliative care: Principal | ICD-10-CM

## 2021-01-17 DIAGNOSIS — E860 Dehydration: Secondary | ICD-10-CM | POA: Diagnosis present

## 2021-01-17 DIAGNOSIS — J439 Emphysema, unspecified: Secondary | ICD-10-CM | POA: Diagnosis not present

## 2021-01-17 DIAGNOSIS — A419 Sepsis, unspecified organism: Secondary | ICD-10-CM | POA: Diagnosis present

## 2021-01-17 DIAGNOSIS — Z79899 Other long term (current) drug therapy: Secondary | ICD-10-CM

## 2021-01-17 DIAGNOSIS — N179 Acute kidney failure, unspecified: Secondary | ICD-10-CM | POA: Diagnosis present

## 2021-01-17 DIAGNOSIS — F0280 Dementia in other diseases classified elsewhere without behavioral disturbance: Secondary | ICD-10-CM | POA: Diagnosis not present

## 2021-01-17 DIAGNOSIS — R52 Pain, unspecified: Secondary | ICD-10-CM

## 2021-01-17 DIAGNOSIS — R64 Cachexia: Secondary | ICD-10-CM | POA: Diagnosis present

## 2021-01-17 DIAGNOSIS — R0902 Hypoxemia: Secondary | ICD-10-CM | POA: Diagnosis not present

## 2021-01-17 DIAGNOSIS — R627 Adult failure to thrive: Secondary | ICD-10-CM | POA: Diagnosis present

## 2021-01-17 DIAGNOSIS — I10 Essential (primary) hypertension: Secondary | ICD-10-CM | POA: Diagnosis present

## 2021-01-17 DIAGNOSIS — E782 Mixed hyperlipidemia: Secondary | ICD-10-CM | POA: Diagnosis present

## 2021-01-17 DIAGNOSIS — I471 Supraventricular tachycardia: Secondary | ICD-10-CM | POA: Diagnosis present

## 2021-01-17 DIAGNOSIS — Z66 Do not resuscitate: Secondary | ICD-10-CM

## 2021-01-17 DIAGNOSIS — Z8701 Personal history of pneumonia (recurrent): Secondary | ICD-10-CM | POA: Diagnosis not present

## 2021-01-17 DIAGNOSIS — I959 Hypotension, unspecified: Secondary | ICD-10-CM | POA: Diagnosis not present

## 2021-01-17 DIAGNOSIS — Z7189 Other specified counseling: Secondary | ICD-10-CM

## 2021-01-17 DIAGNOSIS — I700 Atherosclerosis of aorta: Secondary | ICD-10-CM | POA: Diagnosis not present

## 2021-01-17 DIAGNOSIS — L89152 Pressure ulcer of sacral region, stage 2: Secondary | ICD-10-CM | POA: Diagnosis present

## 2021-01-17 DIAGNOSIS — Z20822 Contact with and (suspected) exposure to covid-19: Secondary | ICD-10-CM | POA: Diagnosis not present

## 2021-01-17 HISTORY — DX: Acute kidney failure, unspecified: N17.9

## 2021-01-17 HISTORY — DX: Unsteadiness on feet: R26.81

## 2021-01-17 HISTORY — DX: Unspecified Escherichia coli (E. coli) as the cause of diseases classified elsewhere: B96.20

## 2021-01-17 HISTORY — DX: Dysphagia following other cerebrovascular disease: I69.891

## 2021-01-17 HISTORY — DX: Urinary tract infection, site not specified: N39.0

## 2021-01-17 HISTORY — DX: Dysphagia, oropharyngeal phase: R13.12

## 2021-01-17 HISTORY — DX: Muscle weakness (generalized): M62.81

## 2021-01-17 MED ORDER — DIPHENHYDRAMINE HCL 50 MG/ML IJ SOLN: 12.5000 mg | INTRAMUSCULAR | Status: DC | PRN

## 2021-01-17 MED ORDER — POLYVINYL ALCOHOL 1.4 % OP SOLN
1.0000 [drp] | Freq: Four times a day (QID) | OPHTHALMIC | Status: DC | PRN
  Filled 2021-01-17: qty 15

## 2021-01-17 MED ORDER — NOREPINEPHRINE 4 MG/250ML-% IV SOLN: 0.0000 ug/min | INTRAVENOUS | Status: DC

## 2021-01-17 MED ORDER — BIOTENE DRY MOUTH MT LIQD
15.0000 mL | Freq: Two times a day (BID) | OROMUCOSAL | Status: DC
  Administered 2021-01-17 – 2021-01-18 (×3): 15 mL via TOPICAL

## 2021-01-17 MED ORDER — ACETAMINOPHEN 325 MG PO TABS: 650.0000 mg | ORAL_TABLET | Freq: Four times a day (QID) | ORAL | Status: DC | PRN

## 2021-01-17 MED ORDER — LACTATED RINGERS IV BOLUS
1000.0000 mL | Freq: Once | INTRAVENOUS | Status: AC
  Administered 2021-01-17: 1000 mL via INTRAVENOUS

## 2021-01-17 MED ORDER — BIOTENE DRY MOUTH MT LIQD: 15.0000 mL | OROMUCOSAL | Status: DC | PRN

## 2021-01-17 MED ORDER — ACETAMINOPHEN 650 MG RE SUPP
650.0000 mg | Freq: Once | RECTAL | Status: AC
  Administered 2021-01-17: 650 mg via RECTAL
  Filled 2021-01-17: qty 1

## 2021-01-17 MED ORDER — LORAZEPAM 2 MG/ML IJ SOLN
1.0000 mg | INTRAMUSCULAR | Status: DC | PRN
  Filled 2021-01-17: qty 1

## 2021-01-17 MED ORDER — GLYCOPYRROLATE 1 MG PO TABS
1.0000 mg | ORAL_TABLET | ORAL | Status: DC | PRN
  Filled 2021-01-17: qty 1

## 2021-01-17 MED ORDER — MORPHINE SULFATE (PF) 2 MG/ML IV SOLN: 1.0000 mg | INTRAVENOUS | Status: DC | PRN

## 2021-01-17 MED ORDER — HALOPERIDOL LACTATE 5 MG/ML IJ SOLN: 2.0000 mg | Freq: Four times a day (QID) | INTRAMUSCULAR | Status: DC | PRN

## 2021-01-17 MED ORDER — MORPHINE BOLUS VIA INFUSION
1.0000 mg | INTRAVENOUS | Status: DC | PRN
  Administered 2021-01-17 – 2021-01-18 (×4): 1 mg via INTRAVENOUS
  Filled 2021-01-17: qty 1

## 2021-01-17 MED ORDER — HALOPERIDOL 0.5 MG PO TABS
0.5000 mg | ORAL_TABLET | ORAL | Status: DC | PRN
  Filled 2021-01-17: qty 1

## 2021-01-17 MED ORDER — SODIUM CHLORIDE 0.9 % IV SOLN
2.0000 g | Freq: Once | INTRAVENOUS | Status: AC
  Administered 2021-01-17: 2 g via INTRAVENOUS
  Filled 2021-01-17: qty 2

## 2021-01-17 MED ORDER — ONDANSETRON 4 MG PO TBDP: 4.0000 mg | ORAL_TABLET | Freq: Four times a day (QID) | ORAL | Status: DC | PRN

## 2021-01-17 MED ORDER — GLYCOPYRROLATE 0.2 MG/ML IJ SOLN: 0.1000 mg | INTRAMUSCULAR | Status: DC | PRN

## 2021-01-17 MED ORDER — LORAZEPAM 2 MG/ML IJ SOLN: 1.0000 mg | INTRAMUSCULAR | Status: DC | PRN

## 2021-01-17 MED ORDER — GLYCOPYRROLATE 0.2 MG/ML IJ SOLN: 0.2000 mg | INTRAMUSCULAR | Status: DC | PRN

## 2021-01-17 MED ORDER — ACETAMINOPHEN 650 MG RE SUPP: 650.0000 mg | Freq: Four times a day (QID) | RECTAL | Status: DC | PRN

## 2021-01-17 MED ORDER — LACTATED RINGERS IV BOLUS
30.0000 mL/kg | Freq: Once | INTRAVENOUS | Status: AC
  Administered 2021-01-17: 1524 mL via INTRAVENOUS

## 2021-01-17 MED ORDER — LORAZEPAM 1 MG PO TABS: 1.0000 mg | ORAL_TABLET | ORAL | Status: DC | PRN

## 2021-01-17 MED ORDER — NOREPINEPHRINE 4 MG/250ML-% IV SOLN
INTRAVENOUS | Status: AC
  Administered 2021-01-17: 10 ug/min via INTRAVENOUS
  Filled 2021-01-17: qty 250

## 2021-01-17 MED ORDER — HALOPERIDOL LACTATE 2 MG/ML PO CONC
2.0000 mg | Freq: Four times a day (QID) | ORAL | Status: DC | PRN
  Filled 2021-01-17: qty 1

## 2021-01-17 MED ORDER — LORAZEPAM 2 MG/ML PO CONC: 1.0000 mg | ORAL | Status: DC | PRN

## 2021-01-17 MED ORDER — HALOPERIDOL LACTATE 5 MG/ML IJ SOLN: 0.5000 mg | INTRAMUSCULAR | Status: DC | PRN

## 2021-01-17 MED ORDER — HALOPERIDOL 1 MG PO TABS
2.0000 mg | ORAL_TABLET | Freq: Four times a day (QID) | ORAL | Status: DC | PRN
  Filled 2021-01-17: qty 2

## 2021-01-17 MED ORDER — HALOPERIDOL LACTATE 2 MG/ML PO CONC
0.5000 mg | ORAL | Status: DC | PRN
  Filled 2021-01-17: qty 0.3

## 2021-01-17 MED ORDER — MORPHINE 100MG IN NS 100ML (1MG/ML) PREMIX INFUSION
2.0000 mg/h | INTRAVENOUS | Status: DC
  Administered 2021-01-17: 2 mg/h via INTRAVENOUS
  Administered 2021-01-18: 3 mg/h via INTRAVENOUS
  Filled 2021-01-17 (×2): qty 100

## 2021-01-17 MED ORDER — ONDANSETRON HCL 4 MG/2ML IJ SOLN: 4.0000 mg | Freq: Four times a day (QID) | INTRAMUSCULAR | Status: DC | PRN

## 2021-01-17 NOTE — Progress Notes (Signed)
Responded to ED page to support patient.  Daughter is  at bedside. Daughter indicated that she was an only child and that she and her mother were very close, and that this will be a big adjustment.. Daughter is struggling to make EOL decision as she and her mother discussed.  Chaplain provided listening,  prayer, guidance, emotional and spiritual support. Daughter appeared to be better after Chaplain visit.  Made daughter aware of Chaplain 24hrs services.  Venida Jarvis, [place], [facility], Pager [phone]

## 2021-01-17 NOTE — ED Notes (Signed)
The Sa02 cord that was in the room isn't working continuous. It works intermittent. Gunnar Fusi, NT said that is because that cord doesn't go to a room monitor and she is looking for another Sa02 cord for that room for me.

## 2021-01-17 NOTE — ED Notes (Signed)
Pt has an ecchymotic blister 5cmx4cm on left inner heel. Pt's heel is ecchymotic.

## 2021-01-17 NOTE — ED Notes (Signed)
Per Leitha Bleak, NP pt is comfort care and I am not to go up on the norepinephrine and when family gets here, I am to turn the norepinephrine off.

## 2021-01-17 NOTE — ED Notes (Signed)
Notified Dr Donnald Garre of pt's low bp and obtained orders

## 2021-01-17 NOTE — ED Notes (Signed)
Dr. Pfeifer at bedside.  

## 2021-01-17 NOTE — ED Triage Notes (Signed)
Pt arrived via [facility] from [facility] for tachycardia. Per EMS pt has hx dementia and is non-verbal. Pt is alert. Pt is in SVT on monitor.

## 2021-01-17 NOTE — ED Provider Notes (Signed)
[facility] EMERGENCY DEPARTMENT Provider Note   CSN: [id number] Arrival date & time: 02/15/2021  0946     History Chief Complaint  Patient presents with   Code Sepsis    Ardelle Haliburton is a 70 y.o. female.  HPI Patient is brought from [facility] for tachycardia.  Patient's history is for dementia and nonverbal at baseline.  Limited additional history at this time.  Patient does not provide any history.  Patient is febrile to 101 on arrival.  Pressures are soft raising concern for sepsis.   Patient's daughter gives additional history.  She reports that her mother has had declining function for about 2-weeks.  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     Family History  Problem Relation Age of Onset   Alzheimer's disease Mother    Dementia Sister     Social History   Tobacco Use   Smoking status: Former    Types: Cigarettes   Smokeless tobacco: Never  Vaping Use   Vaping Use: Never used  Substance Use Topics   Alcohol use: Not Currently   Drug use: Never    Home Medications Prior to Admission  medications   Medication Sig Start Date End Date Taking? Authorizing Provider  amLODipine (NORVASC) 5 MG tablet Take 1 tablet (5 mg total) by mouth daily. 11/23/20   Ngetich, Dinah C, NP  atenolol (TENORMIN) 25 MG tablet Take 1 tablet (25 mg total) by mouth at bedtime. 11/23/20   Ngetich, Dinah C, NP  atorvastatin (LIPITOR) 20 MG tablet Take 1 tablet (20 mg total) by mouth daily at 6 PM. 01/01/21   Noralee Stain, DO  busPIRone (BUSPAR) 15 MG tablet Take 1 tablet (15 mg total) by mouth 2 (two) times daily. 11/23/20   Ngetich, Dinah C, NP  divalproex (DEPAKOTE) 125 MG DR tablet TAKE ONE TABLET BY MOUTH TWICE DAILY 01/04/21   Ngetich, Dinah C, NP  divalproex (DEPAKOTE) 250 MG DR tablet Take 1 tablet (250 mg total) by mouth 2 (two) times daily. 11/23/20   Ngetich, Dinah C, NP  furosemide (LASIX) 40 MG tablet Take 1 tablet (40 mg total) by mouth daily. 11/23/20   Ngetich, Dinah C, NP  loperamide (IMODIUM A-D) 2 MG tablet Take 2 mg by mouth 4 (four) times daily as needed for diarrhea or loose stools.    [provider]  memantine (NAMENDA XR) 28 MG CP24 24 hr capsule Take 1 capsule (28 mg total) by mouth daily. 12/21/20   Ngetich, Donalee Citrin, NP  Multiple Vitamins-Minerals (ABC COMPLETE SENIOR  WOMENS 50+) TABS Take 1 tablet by mouth daily. 10/24/20   [provider]  potassium chloride SA (KLOR-CON) 20 MEQ tablet TAKE ONE TABLET BY MOUTH DAILY 01/04/21   Sharon SellerEubanks, Jessica K, NP  risperiDONE (RISPERDAL) 1 MG tablet TAKE ONE TABLET BY MOUTH TWICE DAILY 01/07/21   Ngetich, Dinah C, NP  traZODone (DESYREL) 100 MG tablet Take 1 tablet (100 mg total) by mouth at bedtime. 11/23/20   Ngetich, Dinah C, NP    Allergies    Donepezil  Review of Systems   Review of Systems Level 5 caveat cannot obtain review of systems due to dementia and nonverbal state. Physical Exam Updated Vital Signs BP 90/74   Pulse (!) 136   Temp 99.3 F (37.4 C) (Rectal)   Resp (!) 30   Ht 5\' 8"  (1.727 m)   Wt 50.8 kg   SpO2 100%    BMI 17.03 kg/m   Physical Exam Constitutional:      Comments: Patient's eyes are slightly open but she is not guarding me or alert in appearance.  Tachypnea without significant respiratory distress.  Patient is very thin and frail in appearance.  HENT:     Mouth/Throat:     Pharynx: Oropharynx is clear.  Cardiovascular:     Comments: Tachycardia soft heart sounds. Pulmonary:     Comments: Tachypnea but not distressed.  Lungs are grossly clear. Abdominal:     Comments: Abdomen soft.  Nondistended.  Genitourinary:    Comments: Patient has a pressure sore over the sacrum.  This has some surrounding erythema and superficial skin breakdown.  This is not through the dermis. Musculoskeletal:     Comments: Patient's left heel has a large about 8 cm bullous lesion slightly purpleish in appearance.  Some erythema surrounding this bullae.  Patient has about 1+ edema both lower extremities.  Skin:    General: Skin is warm and dry.  Neurological:     Comments: Patient is not making any purposeful neurologic responses.  He does not follow commands.    ED Results / Procedures / Treatments   Labs (all labs ordered are listed, but only abnormal results are displayed) Labs Reviewed - No data to display  EKG EKG Interpretation  Date/Time:  Thursday January 17 2021 09:47:14 EDT Ventricular Rate:  159 PR Interval:    QRS Duration: 72 QT Interval:  288 QTC Calculation: 469 R Axis:   52 Text Interpretation: Supraventricular tachycardia Anteroseptal infarct, age indeterminate ST depression, probably rate related QRS morphology similar to older tracing but sig increased rate Confirmed by Arby BarrettePfeiffer, Jailey Booton [phone]([id number]) on 02/10/2021 11:15:24 AM  Radiology DG Chest Port 1 View  Result Date: 02/13/2021 CLINICAL DATA:  UTI for 2 weeks, decreased physical ability, tachycardia, hypotension EXAM: PORTABLE CHEST 1 VIEW COMPARISON:  Chest radiograph 12/29/2020 FINDINGS: The cardiomediastinal silhouette is stable,  with unchanged calcified atherosclerotic plaque of the aortic arch. There is no focal consolidation or pulmonary edema. There is no pleural effusion or pneumothorax. Dextrocurvature of the spine is unchanged. There is no acute osseous abnormality. IMPRESSION: Stable chest with no radiographic evidence of acute cardiopulmonary process. Electronically Signed   By: Lesia HausenPeter  Noone M.D.   On: 01/30/2021 11:15   CT CHEST ABDOMEN PELVIS WO CONTRAST  Result Date: 01/31/2021 CLINICAL DATA:  Pneumonia, tachycardia. EXAM: CT CHEST, ABDOMEN AND PELVIS WITHOUT CONTRAST TECHNIQUE: Multidetector CT imaging of the chest, abdomen and pelvis was performed following the standard protocol without IV contrast. COMPARISON:  None. FINDINGS: CT CHEST FINDINGS Cardiovascular:  Atherosclerosis of thoracic aorta is noted. 4 cm ascending thoracic aortic aneurysm is noted. Normal cardiac size. No pericardial effusion. Mild coronary artery calcifications are noted. Mediastinum/Nodes: No enlarged mediastinal, hilar, or axillary lymph nodes. Thyroid gland, trachea, and esophagus demonstrate no significant findings. Lungs/Pleura: No pneumothorax or pleural effusion is noted. Emphysematous disease is noted in the upper lobes bilaterally. Mild biapical scarring is noted. Right middle lobe airspace opacity is noted most consistent with pneumonia. Musculoskeletal: No chest wall mass or suspicious bone lesions identified. CT ABDOMEN PELVIS FINDINGS Hepatobiliary: No focal liver abnormality is seen. No gallstones, gallbladder wall thickening, or biliary dilatation. Pancreas: Unremarkable. No pancreatic ductal dilatation or surrounding inflammatory changes. Spleen: Normal in size without focal abnormality. Adrenals/Urinary Tract: Adrenal glands are unremarkable. Kidneys are normal, without renal calculi, focal lesion, or hydronephrosis. Bladder is unremarkable. Stomach/Bowel: The stomach appears normal. There is no evidence of bowel obstruction or  inflammation. Stool is noted throughout the colon. The appendix is not clearly visualized. Vascular/Lymphatic: Aortic atherosclerosis. No enlarged abdominal or pelvic lymph nodes. Reproductive: Status post hysterectomy. No adnexal masses. Other: No abdominal wall hernia or abnormality. No abdominopelvic ascites. Musculoskeletal: No acute or significant osseous findings. IMPRESSION: 4 cm ascending thoracic aortic aneurysm. Recommend annual imaging followup by CTA or MRA. This recommendation follows 2010 ACCF/AHA/AATS/ACR/ASA/SCA/SCAI/SIR/STS/SVM Guidelines for the Diagnosis and Management of Patients with Thoracic Aortic Disease. Circulation. 2010; 121: Z610-R604. Aortic aneurysm NOS (ICD10-I71.9). Right middle lobe airspace opacity is noted consistent with pneumonia. No acute abnormality seen in the abdomen or pelvis. Aortic Atherosclerosis (ICD10-I70.0) and Emphysema (ICD10-J43.9). Electronically Signed   By: Lupita Raider M.D.   On: February 15, 2021 13:43    Procedures Procedures  CRITICAL CARE Performed by: Arby Barrette   Total critical care time: 30 minutes  Critical care time was exclusive of separately billable procedures and treating other patients.  Critical care was necessary to treat or prevent imminent or life-threatening deterioration.  Critical care was time spent personally by me on the following activities: development of treatment plan with patient and/or surrogate as well as nursing, discussions with consultants, evaluation of patient's response to treatment, examination of patient, obtaining history from patient or surrogate, ordering and performing treatments and interventions, ordering and review of laboratory studies, ordering and review of radiographic studies, pulse oximetry and re-evaluation of patient's condition.  Medications Ordered in ED Medications  norepinephrine (LEVOPHED) 4mg  in premix infusion (10 mcg/min Intravenous New Bag/Given February 15, 2021 1317)  morphine 2 MG/ML  injection 1 mg (has no administration in time range)  LORazepam (ATIVAN) injection 1 mg (has no administration in time range)  lactated ringers bolus 1,524 mL (0 mL/kg  50.8 kg Intravenous Stopped Feb 15, 2021 1130)  ceFEPIme (MAXIPIME) 2 g in sodium chloride 0.9 % 100 mL IVPB (0 g Intravenous Stopped 02/15/2021 1050)  acetaminophen (TYLENOL) suppository 650 mg (650 mg Rectal Given Feb 15, 2021 1057)  lactated ringers bolus 1,000 mL (0 mLs Intravenous Stopped 2021-02-15 1301)  lactated ringers bolus 1,000 mL (0 mLs Intravenous Stopped 2021-02-15 1411)    ED Course  I have reviewed the triage vital signs and the nursing notes.  Pertinent labs & imaging results that were available during my care of the patient were reviewed by me and considered in my medical decision making (see chart for details).  Clinical Course as of 02-15-21 1513  Thu 2021/02/15  1435 Consult: Intensivist for admission [MP]    Clinical Course User Index [MP] Jan 19, 2021, MD   MDM Rules/Calculators/A&P  Patient presents as outlined.  She presents with hypotension, tachycardia and fever.  Sepsis protocol initiated.  After return of labs patient show signs significant dehydration and additional 2 L LR administered.  Patient remained hypotensive, Levophed initiated.  At that time, intensivist consulted for admission.  After evaluation and review of family, determination was made to go with comfort care and withdraw pressors and Levophed.  At this time patient we admitted for comfort care only without further antibiotics, fluids or other life-sustaining interventions.  At this time, family is awaiting additional family member arrival before withdrawing Levophed.  Final Clinical Impression(s) / ED Diagnoses Final diagnoses:  Sepsis, due to unspecified organism, unspecified whether acute organ dysfunction present [facility])  DNR no code (do not resuscitate)    Rx / DC Orders ED Discharge Orders     None         Arby Barrette, MD 2021/01/29 1515

## 2021-01-17 NOTE — ED Notes (Signed)
Patient transported to CT 

## 2021-01-17 NOTE — Consult Note (Signed)
NAME:  Chelsea Aguirre, MRN:  [id number], DOB:  [birth year], LOS: 0 ADMISSION DATE:  02/06/2021, CONSULTATION DATE:  01/20/2021 REFERRING MD:  Dr. Clarice Pole, CHIEF COMPLAINT:  Septic Shock    History of Present Illness:  70 year old female presents from [facility] with tachycardia, hypotension, and progressive lethargy. Daughter reports that patient recently move here from [place] for she could take care of her. For the last month patient has experienced a significant decline in health, previously patient was able to perform most ADL's with coaching. 8/11-8/16 patient admitted with E.Coli UTI. After this admission discharged to SNF, patient has since declined further, daughter reports that patient is bed-bound and most days is lethargic and hard to arouse.   On arrival to ED HR 157, BP 85/76. Temp 101. Given 3.5 L Fluids and Cefepime. CT C/A/P with right middle lobe airspace opacity. WBC 18, NA 157, K 5.4, Crt 1.14, BUN 71, AST/ALT 140/231. Lactic 2.7. Started on levophed for continued hypotension despite fluids.   Previously daughter and patient have had extensive goals of care conversations as her mother also had advanced dementia. Daughter states that patient would not want to continue to live this way. On last admission spoke with palliative care where she was made a DNR/DNI, MOST form states antibiotics and fluids only.    Pertinent  Medical History  Dementia, HLD, HLD, Anxiety   Significant Hospital Events: Including procedures, antibiotic start and stop dates in addition to other pertinent events   9/1 > Presents to ED   Interim History / Subjective:  As above.   Objective   Blood pressure 90/74, pulse (!) 136, temperature 99.3 F (37.4 C), temperature source Rectal, resp. rate (!) 30, height 5\' 8"  (1.727 m), weight 50.8 kg, SpO2 100 %.        Intake/Output Summary (Last 24 hours) at 01/24/2021 1459 Last data filed at 02/07/2021 1411 Gross per 24 hour  Intake 3624 ml  Output --  Net 3624  ml   Filed Weights   01/20/2021 0956  Weight: 50.8 kg    Examination: General: elderly female, lying in bed, no distress HENT: Dry MM  Lungs: moderate accessory muscle use  Cardiovascular: tachy, HR 138  Neuro: lethargic, does not follow commands   Resolved Hospital Problem list     Assessment & Plan:   Septic Shock Right Middle Lobe PNA  Acute Renal Injury  Lactic Acidosis Advanced Dementia  Plan  -Extensive GOC conversation with daughter, states patient would not want extensive life saving measures at this time including vasopressors, given progressive decline in health and poor quality of life daughter wishes to proceed with comfort care, plan to d/c levophed once family arrives  -ativan, morphine PRN  -robinul PRN    Labs   CBC: No results for input(s): WBC, NEUTROABS, HGB, HCT, MCV, PLT in the last 168 hours.  Basic Metabolic Panel: No results for input(s): NA, K, CL, CO2, GLUCOSE, BUN, CREATININE, CALCIUM, MG, PHOS in the last 168 hours. GFR: Estimated Creatinine Clearance: 52.5 mL/min (by C-G formula based on SCr of 0.75 mg/dL). No results for input(s): PROCALCITON, WBC, LATICACIDVEN in the last 168 hours.  Liver Function Tests: No results for input(s): AST, ALT, ALKPHOS, BILITOT, PROT, ALBUMIN in the last 168 hours. No results for input(s): LIPASE, AMYLASE in the last 168 hours. No results for input(s): AMMONIA in the last 168 hours.  ABG No results found for: PHART, PCO2ART, PO2ART, HCO3, TCO2, ACIDBASEDEF, O2SAT   Coagulation Profile: No results  for input(s): INR, PROTIME in the last 168 hours.  Cardiac Enzymes: No results for input(s): CKTOTAL, CKMB, CKMBINDEX, TROPONINI in the last 168 hours.  HbA1C: No results found for: HGBA1C  CBG: No results for input(s): GLUCAP in the last 168 hours.  Review of Systems:   Unable to review   Past Medical History:  She,  has a past medical history of Anxiety, Dementia (HCC), Hyperlipidemia, and Hypertension.    Surgical History:   Past Surgical History:  Procedure Laterality Date   ABDOMINAL HYSTERECTOMY       Social History:   reports that she has quit smoking. Her smoking use included cigarettes. She has never used smokeless tobacco. She reports that she does not currently use alcohol. She reports that she does not use drugs.   Family History:  Her family history includes Alzheimer's disease in her mother; Dementia in her sister.   Allergies Allergies  Allergen Reactions   Donepezil Diarrhea and Other (See Comments)    Stopped by MD due to concern for Colitis     Home Medications  Prior to Admission medications   Medication Sig Start Date End Date Taking? Authorizing Provider  amLODipine (NORVASC) 5 MG tablet Take 1 tablet (5 mg total) by mouth daily. 11/23/20   Ngetich, Dinah C, NP  atenolol (TENORMIN) 25 MG tablet Take 1 tablet (25 mg total) by mouth at bedtime. 11/23/20   Ngetich, Dinah C, NP  atorvastatin (LIPITOR) 20 MG tablet Take 1 tablet (20 mg total) by mouth daily at 6 PM. 01/01/21   Noralee Stain, DO  busPIRone (BUSPAR) 15 MG tablet Take 1 tablet (15 mg total) by mouth 2 (two) times daily. 11/23/20   Ngetich, Dinah C, NP  divalproex (DEPAKOTE) 125 MG DR tablet TAKE ONE TABLET BY MOUTH TWICE DAILY 01/04/21   Ngetich, Dinah C, NP  divalproex (DEPAKOTE) 250 MG DR tablet Take 1 tablet (250 mg total) by mouth 2 (two) times daily. 11/23/20   Ngetich, Dinah C, NP  furosemide (LASIX) 40 MG tablet Take 1 tablet (40 mg total) by mouth daily. 11/23/20   Ngetich, Dinah C, NP  loperamide (IMODIUM A-D) 2 MG tablet Take 2 mg by mouth 4 (four) times daily as needed for diarrhea or loose stools.    [provider]  memantine (NAMENDA XR) 28 MG CP24 24 hr capsule Take 1 capsule (28 mg total) by mouth daily. 12/21/20   Ngetich, Donalee Citrin, NP  Multiple Vitamins-Minerals (ABC COMPLETE SENIOR WOMENS 50+) TABS Take 1 tablet by mouth daily. 10/24/20   [provider]  potassium chloride SA  (KLOR-CON) 20 MEQ tablet TAKE ONE TABLET BY MOUTH DAILY 01/04/21   Sharon Seller, NP  risperiDONE (RISPERDAL) 1 MG tablet TAKE ONE TABLET BY MOUTH TWICE DAILY 01/07/21   Ngetich, Dinah C, NP  traZODone (DESYREL) 100 MG tablet Take 1 tablet (100 mg total) by mouth at bedtime. 11/23/20   Ngetich, Donalee Citrin, NP

## 2021-01-17 NOTE — ED Notes (Signed)
Pt has foam dressing on left heel

## 2021-01-17 NOTE — H&P (Signed)
History and Physical    Chelsea Aguirre TDD:[id number] DOB: 14-Jun-1950 DOA: 01/30/2021  PCP: Caesar Bookman, NP Consultants:  None Patient coming from: [facility]; NOK: Daughter, Stefani Baik, [phone]  Chief Complaint: Code sepsis  HPI: Chelsea Aguirre is a 69 y.o. 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 Her daughter reports that her mother was having difficulty with dementia and wandering behaviors while she was in [place], so she moved her to [name] over Memorial Day.  She was hospitalized at [facility] from 8/11-16 for E coli UTI and was discharged to SNF.  While there, she has continued to dwindle.  She had a fever shortly after arrival and has been losing weight.  She was still eating ok.  Last night, she seemed less interactive (although hasn't been speaking since last admission other than nonsensical monosyllabic sounds periodically).  Overnight, she spiked a fever.  In the ER, she was diagnosed with septic shock and started on levophed, IVF, and antibiotics.  PCCM was consulted.  After discussion with PCCM, the daughter requested transition to comfort care only.  I spoke with the daughter and this is in accordance with her mother's wishes.    ED Course: Started on levophed and PCCM consulted.  Daughter agreed to comfort.  Will stop levophed and all treatments once the rest of the family arrives.  Review of Systems: unable to perform  Ambulatory Status:  Non ambulatory   Past Medical History:  Diagnosis Date   Acute kidney failure, unspecified (HCC)    Anxiety    Dementia (HCC)    Dysphagia following other cerebrovascular disease    Hyperlipidemia    Hypertension    Muscle weakness (generalized)    Oropharyngeal dysphagia    Unspecified Escherichia coli (E. coli) as the cause of diseases classified elsewhere    Unsteadiness on feet    Urinary tract infection, site not specified     Past Surgical History:   Procedure Laterality Date   ABDOMINAL HYSTERECTOMY      Social History   Socioeconomic History   Marital status: Divorced    Spouse name: Not on file   Number of children: Not on file   Years of education: Not on file   Highest education level: Not on file  Occupational History   Not on file  Tobacco Use   Smoking status: Former    Types: Cigarettes   Smokeless tobacco: Never  Vaping Use   Vaping Use: Never used  Substance and Sexual Activity   Alcohol use: Not Currently   Drug use: Never   Sexual activity: Not on file  Other Topics Concern   Not on file  Social History Narrative   Tobacco use, amount per day now: None.   Past tobacco use, amount per day: Smoker much of life, stopped a few months ago. One pack a day or so.   How many years did you use tobacco: 25 years or 50.   Alcohol use (drinks per week): Socially drank when younger   Diet: Regular    Do you drink/eat things with caffeine: No   Marital status:  Divorced                                What year were you married? Last marriage 20.   Do you live in a house, apartment, assisted living, condo, trailer, etc.? House  Is it one or more stories? 2 stories.    How many persons live in your home? 3   Do you have pets in your home?( please list) Yes 3-Dogs   Highest Level of education completed? High School Diploma.   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   Do you exercise?  Michaelyn BarterWanders a lot                                Type and how often?   Do you have a living will? No   Do you have a DNR form?    No                               If not, do you want to discuss one?   Do you have signed POA/HPOA forms?       Yes                 If so, please bring to you appointment      Do you have any difficulty bathing or dressing yourself? Yes   Do you have any difficulty preparing food or eating? Yes   Do you have any difficulty managing your medications? Yes   Do you have any difficulty  managing your finances? Yes    Do you have any difficulty affording your medications? No   Social Determinants of Corporate investment bankerHealth   Financial Resource Strain: Not on file  Food Insecurity: Not on file  Transportation Needs: Not on file  Physical Activity: Not on file  Stress: Not on file  Social Connections: Not on file  Intimate Partner Violence: Not on file    Allergies  Allergen Reactions   Donepezil Diarrhea and Other (See Comments)    Stopped by MD due to concern for Colitis    Family History  Problem Relation Age of Onset   Alzheimer's disease Mother    Dementia Sister     Prior to Admission medications   Medication Sig Start Date End Date Taking? Authorizing Provider  amLODipine (NORVASC) 5 MG tablet Take 1 tablet (5 mg total) by mouth daily. 11/23/20   Ngetich, Dinah C, NP  atenolol (TENORMIN) 25 MG tablet Take 1 tablet (25 mg total) by mouth at bedtime. 11/23/20   Ngetich, Dinah C, NP  atorvastatin (LIPITOR) 20 MG tablet Take 1 tablet (20 mg total) by mouth daily at 6 PM. 01/01/21   Noralee Stainhoi, Iliana Hutt, DO  busPIRone (BUSPAR) 15 MG tablet Take 1 tablet (15 mg total) by mouth 2 (two) times daily. 11/23/20   Ngetich, Dinah C, NP  divalproex (DEPAKOTE) 125 MG DR tablet TAKE ONE TABLET BY MOUTH TWICE DAILY 01/04/21   Ngetich, Dinah C, NP  divalproex (DEPAKOTE) 250 MG DR tablet Take 1 tablet (250 mg total) by mouth 2 (two) times daily. 11/23/20   Ngetich, Dinah C, NP  furosemide (LASIX) 40 MG tablet Take 1 tablet (40 mg total) by mouth daily. 11/23/20   Ngetich, Dinah C, NP  loperamide (IMODIUM A-D) 2 MG tablet Take 2 mg by mouth 4 (four) times daily as needed for diarrhea or loose stools.    [provider]  memantine (NAMENDA XR) 28 MG CP24 24 hr capsule Take 1 capsule (28 mg total) by mouth daily. 12/21/20   Ngetich, Donalee Citrininah C, NP  Multiple Vitamins-Minerals (ABC COMPLETE SENIOR WOMENS 50+) TABS  Take 1 tablet by mouth daily. 10/24/20   [provider]  potassium chloride SA (KLOR-CON)  20 MEQ tablet TAKE ONE TABLET BY MOUTH DAILY 01/04/21   Sharon Seller, NP  risperiDONE (RISPERDAL) 1 MG tablet TAKE ONE TABLET BY MOUTH TWICE DAILY 01/07/21   Ngetich, Dinah C, NP  traZODone (DESYREL) 100 MG tablet Take 1 tablet (100 mg total) by mouth at bedtime. 11/23/20   Ngetich, Donalee Citrin, NP    Physical Exam: Vitals:   01/23/2021 1530 02/07/2021 1600 01/25/2021 1605 02/13/2021 1623  BP: (!) 82/62 (!) 75/57 (!) 71/58 (!) 60/47  Pulse: (!) 137 (!) 140  80  Resp: (!) 24 (!) 29 (!) 31   Temp:    97.9 F (36.6 C)  TempSrc:    Axillary  SpO2: 100% 100%  100%  Weight:      Height:         General:  Appears frail, withdrawn Eyes:   normal lids ENT:  dry mm Neck:  no LAD, masses or thyromegaly Cardiovascular:  RR with tachycardia. 2+ LE edema.  Respiratory:   Diffusely decreased breath sounds.  Increased respiratory effort. Abdomen:  soft, NT, ND Skin:  no rash or induration seen on limited exam Musculoskeletal:  decreased tone BUE/BLE, no bony abnormality Psychiatric:  obtunded, unresponsive Neurologic:  unable to perform    Radiological Exams on Admission: Independently reviewed - see discussion in A/P where applicable  DG Chest Port 1 View  Result Date: 01/28/2021 CLINICAL DATA:  UTI for 2 weeks, decreased physical ability, tachycardia, hypotension EXAM: PORTABLE CHEST 1 VIEW COMPARISON:  Chest radiograph 12/29/2020 FINDINGS: The cardiomediastinal silhouette is stable, with unchanged calcified atherosclerotic plaque of the aortic arch. There is no focal consolidation or pulmonary edema. There is no pleural effusion or pneumothorax. Dextrocurvature of the spine is unchanged. There is no acute osseous abnormality. IMPRESSION: Stable chest with no radiographic evidence of acute cardiopulmonary process. Electronically Signed   By: Lesia Hausen M.D.   On: 02/07/2021 11:15   CT CHEST ABDOMEN PELVIS WO CONTRAST  Result Date: 01/28/2021 CLINICAL DATA:  Pneumonia, tachycardia. EXAM: CT CHEST,  ABDOMEN AND PELVIS WITHOUT CONTRAST TECHNIQUE: Multidetector CT imaging of the chest, abdomen and pelvis was performed following the standard protocol without IV contrast. COMPARISON:  None. FINDINGS: CT CHEST FINDINGS Cardiovascular: Atherosclerosis of thoracic aorta is noted. 4 cm ascending thoracic aortic aneurysm is noted. Normal cardiac size. No pericardial effusion. Mild coronary artery calcifications are noted. Mediastinum/Nodes: No enlarged mediastinal, hilar, or axillary lymph nodes. Thyroid gland, trachea, and esophagus demonstrate no significant findings. Lungs/Pleura: No pneumothorax or pleural effusion is noted. Emphysematous disease is noted in the upper lobes bilaterally. Mild biapical scarring is noted. Right middle lobe airspace opacity is noted most consistent with pneumonia. Musculoskeletal: No chest wall mass or suspicious bone lesions identified. CT ABDOMEN PELVIS FINDINGS Hepatobiliary: No focal liver abnormality is seen. No gallstones, gallbladder wall thickening, or biliary dilatation. Pancreas: Unremarkable. No pancreatic ductal dilatation or surrounding inflammatory changes. Spleen: Normal in size without focal abnormality. Adrenals/Urinary Tract: Adrenal glands are unremarkable. Kidneys are normal, without renal calculi, focal lesion, or hydronephrosis. Bladder is unremarkable. Stomach/Bowel: The stomach appears normal. There is no evidence of bowel obstruction or inflammation. Stool is noted throughout the colon. The appendix is not clearly visualized. Vascular/Lymphatic: Aortic atherosclerosis. No enlarged abdominal or pelvic lymph nodes. Reproductive: Status post hysterectomy. No adnexal masses. Other: No abdominal wall hernia or abnormality. No abdominopelvic ascites. Musculoskeletal: No acute or significant osseous findings.  IMPRESSION: 4 cm ascending thoracic aortic aneurysm. Recommend annual imaging followup by CTA or MRA. This recommendation follows 2010  ACCF/AHA/AATS/ACR/ASA/SCA/SCAI/SIR/STS/SVM Guidelines for the Diagnosis and Management of Patients with Thoracic Aortic Disease. Circulation. 2010; 121: V956-L875. Aortic aneurysm NOS (ICD10-I71.9). Right middle lobe airspace opacity is noted consistent with pneumonia. No acute abnormality seen in the abdomen or pelvis. Aortic Atherosclerosis (ICD10-I70.0) and Emphysema (ICD10-J43.9). Electronically Signed   By: Lupita Raider M.D.   On: 02/03/2021 13:43    EKG: Independently reviewed.  SVT with rate 159   Labs on Admission: I have personally reviewed the available labs and imaging studies at the time of the admission.  Pertinent labs:   Not done   Assessment/Plan Principal Problem:   Admission for end of life care Active Problems:   Dementia (HCC)   Severe sepsis with septic shock (HCC)   Sepsis secondary to UTI [facility])   -Patient presenting with apparent sepsis, possibly related to UTI in the setting of recent admission for the same -She was initially started on pressors, IVF, antibiotics -After discussion in the ER with PCCM, family has decided to proceed with comfort care only -She was admitted to [facility] for comfort care and palliative care consult -Patient is likely to experience overnight in-hospital demise -Comfort care order set utilized -No antibiotics or IVF as per family's request -Pain control with morphine drip low-dose, as patient is unable to complain of pain.   DVT prophylaxis: None - comfort measures Code Status: DNR - confirmed with family Family Communication: Daughter was present throughout evaluation Disposition Plan: Anticipate in-hospital death Consults called: PCCM; Palliative care Admission status: Admit - It is my clinical opinion that admission to INPATIENT is reasonable and necessary because of the expectation that this patient will require hospital care that crosses at least 2 midnights to treat this condition based on the medical complexity of the problems  presented.  Given the aforementioned information, the predictability of an adverse outcome is felt to be significant.     Jonah Blue MD [facility]   How to contact the [facility] Attending or Consulting provider 7A - 7P or covering provider during after hours 7P -7A, for this patient?  Check the care team in [facility] and look for a) attending/consulting TR[facility] provider listed and b) the [facility] team listed Log into [email or web address] and use [place]'s universal password to access. If you do not have the password, please contact the hospital operator. Locate the [facility] provider you are looking for under [facility] and page to a number that you can be directly reached. If you still have difficulty reaching the provider, please page the [facility] (Director on Call) for the Hospitalists listed on amion for assistance.   02/13/2021, 5:01 PM

## 2021-01-17 NOTE — Consult Note (Signed)
Consultation Note Date: 01/30/2021   Patient Name: Chelsea Aguirre  DOB: [birth year]  MRN: [id number]  Age / Sex: 70 y.o., female  PCP: Ngetich, Nelda Bucks, NP Referring Physician: Karmen Bongo, MD  Reason for Consultation: Non pain symptom management, Pain control, Psychosocial/spiritual support, and Terminal Care  HPI/Patient Profile: 70 y.o. 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 Code Sepsis and pressors were initiated in ED. After discussion with family, patient was admitted on 02/02/2021 for comfort measures only.   Of note, patient was hospitalized from 8/11-8/16/22 for UTI. She was followed by PMT at that time and discharged to rehab with outpatient Palliative Care to follow.   Clinical Assessment and Goals of Care: I have reviewed medical records including EPIC notes, labs, and imaging. Received report from primary RN - no acute concerns.   Went to visit patient at bedside - daughter/Chelsea present. Patient was lying in bed - she does not wake to voice/gentle touch. No signs or non-verbal gestures of pain or discomfort noted. No respiratory distress, increased work of breathing, or secretions noted. Patient is on 2L O2 [place] and morphine drip has been initiated.  Met with Chelsea Aguirre  to discuss diagnosis, prognosis, GOC, EOL wishes, disposition, and options.   I re-introduced Palliative Medicine as specialized medical care for people living with serious illness. It focuses on providing relief from the symptoms and stress of a serious illness. The goal is to improve quality of life for both the patient and the family.  We discussed interval history since patient's last admission several weeks ago. Chelsea Aguirre tells me that the patient "never bounced back" and when she was discharged to rehab and continued to decline -  reports the patient has lost 15 pounds over the last 2 weeks. Chelsea Aguirre states that the patient would not find her current situation a good quality of life, which has lead to her decision for comfort care. Therapeutic listening and emotional support provided as Chelsea Aguirre is understandably tearful discussing the patient's decline, their close relationship, and recent meaningful conversations she and the patient were able to have. We reviewed natural disease trajectory of dementia. Validated that Chelsea Aguirre did all she could to ensure the patient was taken care of and that comfort measures at this time for patient was a very reasonable option. Chelsea expressed appreciation.   We talked about transition to comfort measures in house and what that would entail inclusive of medications to control pain, dyspnea, agitation, nausea, itching, and hiccups. We discussed stopping all unnecessary measures such as blood draws, needle sticks, oxygen, antibiotics, CBGs/insulin, cardiac monitoring, IVF, and frequent vital signs. Chelsea Aguirre was agreeable.   Prognostication was reviewed. Recommended patient remain in house for EOL care as patient does not seem stable for transfer at this time - Chelsea Aguirre is agreeable for patient to remain in house and states she "prefers this."  Unrestricted visitation policy reviewed - Chelsea expressed understanding and appreciation. Offered chaplain services -  a chaplain has seen and supported Chelsea and patient - she expressed appreciation and declines follow up visit at this time.  Visit also consisted of discussions dealing with the complex and emotionally intense issues of symptom management and palliative care in the setting of serious and potentially life-threatening illness. Palliative care team will continue to support patient, patient's family, and medical team.  Discussed with family the importance of continued conversation with each other and the medical providers regarding overall plan of care  and treatment options, ensuring decisions are within the context of the patient's values and GOCs.    Questions and concerns were addressed. The patient/family was encouraged to call with questions and/or concerns. PMT card was provided.   Primary Decision Maker: Chelsea Aguirre/daughter    SUMMARY OF RECOMMENDATIONS   Continue full comfort measures - patient will likely pass within the next 24 hours Continue DNR/DNI as previously documented Patient is not stable for transfer at this time - family agreeable for patient to remain in house for EOL care Added orders for EOL symptom management and to reflect full comfort measures, as well as discontinued orders that were not focused on comfort Unrestricted visitation orders were placed per current [facility] EOL visitation policy  If family agreeable, can remove oxygen and manage symptoms with comfort medications. Please see oxygen therapy order Provide frequent assessments and administer PRN medications as clinically necessary to ensure EOL comfort PMT will continue to follow holistically   Code Status/Advance Care Planning: DNR  Palliative Prophylaxis:  Aspiration, Bowel Regimen, Delirium Protocol, Frequent Pain Assessment, Oral Care, and Turn Reposition  Additional Recommendations (Limitations, Scope, Preferences): Full Comfort Care  Psycho-social/Spiritual:  Desire for further Chaplaincy support:no Created space and opportunity for patient and family to express thoughts and feelings regarding patient's current medical situation.  Emotional support and therapeutic listening provided.  Prognosis:  Hours - Days  Discharge Planning: Anticipated Hospital Death      Primary Diagnoses: Present on Admission: **None**   I have reviewed the medical record, interviewed the patient and family, and examined the patient. The following aspects are pertinent.  Past Medical History:  Diagnosis Date   Acute kidney failure,  unspecified ([place])    Anxiety    Dementia ([place])    Dysphagia following other cerebrovascular disease    Hyperlipidemia    Hypertension    Muscle weakness (generalized)    Oropharyngeal dysphagia    Unspecified Escherichia coli (E. coli) as the cause of diseases classified elsewhere    Unsteadiness on feet    Urinary tract infection, site not specified    Social History   Socioeconomic History   Marital status: Divorced    Spouse name: Not on file   Number of children: Not on file   Years of education: Not on file   Highest education level: Not on file  Occupational History   Not on file  Tobacco Use   Smoking status: Former    Types: Cigarettes   Smokeless tobacco: Never  Vaping Use   Vaping Use: Never used  Substance and Sexual Activity   Alcohol use: Not Currently   Drug use: Never   Sexual activity: Not on file  Other Topics Concern   Not on file  Social History Narrative   Tobacco use, amount per day now: None.   Past tobacco use, amount per day: Smoker much of life, stopped a few months ago. One pack a day or so.   How many years did you use  tobacco: 25 years or 50.   Alcohol use (drinks per week): Socially drank when younger   Diet: Regular    Do you drink/eat things with caffeine: No   Marital status:  Divorced                                What year were you married? Last marriage 1987.   Do you live in a house, apartment, assisted living, condo, trailer, etc.? House   Is it one or more stories? 2 stories.    How many persons live in your home? 3   Do you have pets in your home?( please list) Yes 3-Dogs   Highest Level of education completed? High School Diploma.   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   Do you exercise?  Wanders a lot                                Type and how often?   Do you have a living will? No   Do you have a DNR form?    No                               If not, do you want to discuss one?   Do you have  signed POA/HPOA forms?       Yes                 If so, please bring to you appointment      Do you have any difficulty bathing or dressing yourself? Yes   Do you have any difficulty preparing food or eating? Yes   Do you have any difficulty managing your medications? Yes   Do you have any difficulty managing your finances? Yes    Do you have any difficulty affording your medications? No   Social Determinants of Health   Financial Resource Strain: Not on file  Food Insecurity: Not on file  Transportation Needs: Not on file  Physical Activity: Not on file  Stress: Not on file  Social Connections: Not on file   Family History  Problem Relation Age of Onset   Alzheimer's disease Mother    Dementia Sister    Scheduled Meds: Continuous Infusions:  morphine     PRN Meds:.acetaminophen **OR** acetaminophen, antiseptic oral rinse, diphenhydrAMINE, glycopyrrolate **OR** glycopyrrolate **OR** glycopyrrolate, haloperidol **OR** haloperidol **OR** haloperidol lactate, LORazepam **OR** LORazepam **OR** LORazepam, morphine, ondansetron **OR** ondansetron (ZOFRAN) IV, polyvinyl alcohol Medications Prior to Admission:  Prior to Admission medications   Medication Sig Start Date End Date Taking? Authorizing Provider  amLODipine (NORVASC) 5 MG tablet Take 1 tablet (5 mg total) by mouth daily. 11/23/20   Ngetich, Dinah C, NP  atenolol (TENORMIN) 25 MG tablet Take 1 tablet (25 mg total) by mouth at bedtime. 11/23/20   Ngetich, Dinah C, NP  atorvastatin (LIPITOR) 20 MG tablet Take 1 tablet (20 mg total) by mouth daily at 6 PM. 01/01/21   Choi, Jennifer, DO  busPIRone (BUSPAR) 15 MG tablet Take 1 tablet (15 mg total) by mouth 2 (two) times daily. 11/23/20   Ngetich, Dinah C, NP  divalproex (DEPAKOTE) 125 MG DR tablet TAKE ONE TABLET BY MOUTH TWICE DAILY 01/04/21   Ngetich, Dinah C, NP  divalproex (DEPAKOTE) 250 MG DR tablet Take   1 tablet (250 mg total) by mouth 2 (two) times daily. 11/23/20   Ngetich, Dinah C, NP   furosemide (LASIX) 40 MG tablet Take 1 tablet (40 mg total) by mouth daily. 11/23/20   Ngetich, Dinah C, NP  loperamide (IMODIUM A-D) 2 MG tablet Take 2 mg by mouth 4 (four) times daily as needed for diarrhea or loose stools.    [provider]  memantine (NAMENDA XR) 28 MG CP24 24 hr capsule Take 1 capsule (28 mg total) by mouth daily. 12/21/20   Ngetich, Dinah C, NP  Multiple Vitamins-Minerals (ABC COMPLETE SENIOR WOMENS 50+) TABS Take 1 tablet by mouth daily. 10/24/20   [provider]  potassium chloride SA (KLOR-CON) 20 MEQ tablet TAKE ONE TABLET BY MOUTH DAILY 01/04/21   Eubanks, Jessica K, NP  risperiDONE (RISPERDAL) 1 MG tablet TAKE ONE TABLET BY MOUTH TWICE DAILY 01/07/21   Ngetich, Dinah C, NP  traZODone (DESYREL) 100 MG tablet Take 1 tablet (100 mg total) by mouth at bedtime. 11/23/20   Ngetich, Dinah C, NP   Allergies  Allergen Reactions   Donepezil Diarrhea and Other (See Comments)    Stopped by MD due to concern for Colitis   Review of Systems  Unable to perform ROS: Acuity of condition   Physical Exam Vitals and nursing note reviewed.  Constitutional:      General: She is not in acute distress.    Appearance: She is ill-appearing.  Pulmonary:     Effort: No respiratory distress.  Skin:    General: Skin is cool and dry.  Neurological:     Mental Status: She is unresponsive.     Motor: Weakness present.  Psychiatric:        Speech: She is noncommunicative.    Vital Signs: BP (!) 60/47 (BP Location: Right Arm)   Pulse 80   Temp 97.9 F (36.6 C) (Axillary)   Resp (!) 31   Ht 5' 8" (1.727 m)   Wt 50.8 kg   SpO2 100%   BMI 17.03 kg/m          SpO2: SpO2: 100 % O2 Device:SpO2: 100 % O2 Flow Rate: .O2 Flow Rate (L/min): 2 L/min  IO: Intake/output summary:  Intake/Output Summary (Last 24 hours) at 02/09/2021 1658 Last data filed at 02/08/2021 1545 Gross per 24 hour  Intake 3716.5 ml  Output --  Net 3716.5 ml    LBM:   Baseline Weight: Weight:  50.8 kg Most recent weight: Weight: 50.8 kg     Palliative Assessment/Data: PPS 10%     Time In: 1645 Time Out: 1800 Time Total: 75 minutes  Greater than 50%  of this time was spent counseling and coordinating care related to the above assessment and plan.  Signed by:  M , NP   Please contact Palliative Medicine Team phone at [phone] for questions and concerns.  For individual provider: See Amion              

## 2021-01-18 DIAGNOSIS — A419 Sepsis, unspecified organism: Secondary | ICD-10-CM | POA: Diagnosis not present

## 2021-01-18 DIAGNOSIS — Z515 Encounter for palliative care: Secondary | ICD-10-CM | POA: Diagnosis not present

## 2021-01-18 DIAGNOSIS — F0390 Unspecified dementia without behavioral disturbance: Secondary | ICD-10-CM | POA: Diagnosis not present

## 2021-01-18 DIAGNOSIS — Z7189 Other specified counseling: Secondary | ICD-10-CM | POA: Diagnosis not present

## 2021-01-19 DIAGNOSIS — Z515 Encounter for palliative care: Secondary | ICD-10-CM | POA: Diagnosis not present

## 2021-01-22 LAB — CULTURE, BLOOD (ROUTINE X 2): Culture: NO GROWTH

## 2021-01-22 LAB — CULTURE, BLOOD (SINGLE): Culture: NO GROWTH

## 2021-02-16 NOTE — Progress Notes (Addendum)
Called to room by family. Pt noted not breathing and pulseless verified by Charge RN. Notified on call doctor X. Blount,NP that pt expired@ 2248.

## 2021-02-16 NOTE — Progress Notes (Signed)
Pt breathing more labored and shallow and groaning. Gave dose of ativan.  Son is on his way from [place], should be here about 1730. Daughter okay if pt passes before then, just wants mother to be comfortable.

## 2021-02-16 NOTE — Progress Notes (Addendum)
Daily Progress Note   Patient Name: Chelsea Aguirre       Date: 01/28/2021 DOB: September 06, 1950  Age: 70 y.o. MRN#: [id number] Attending Physician: Rhetta Mura, MD Primary Care Physician: Caesar Bookman, NP Admit Date: 02/05/2021  Reason for Consultation/Follow-up: Non pain symptom management, Pain control, Psychosocial/spiritual support, and Terminal Care  Subjective: Chart review performed. Morphine drip was increased to 62ml/h. Received report from primary RN - no acute concerns. Reports patient's bladder scan is over 600 and will place foley per order.  Went to visit patient at bedside - daughter/[name], sister/Julie, and [name]'s friend/Amy present in room. Patient was lying in bed - she does not wake to voice/gentle touch. No signs or non-verbal gestures of pain or discomfort noted. No respiratory distress or secretions noted; slight increased work of breathing. Patient remains on 2L O2 .   Emotional support provided to family/visitors. Answered questions about natural trajectory/expectations at EOL - including but not limited to apneic breathing and urinary retention. "Gone From My Sight" book provided. Education provided on why foley will be placed. Education about oxygen use at EOL provided - recommendation given to remove oxygen - family were agreeable.   1mg  bolus dose of morphine given for slight increased work of breathing and for symptom management in anticipation of oxygen removal.   All questions and concerns addressed. Encouraged to call with questions and/or concerns. PMT card previously provided.  Requested RN to remove oxygen when foley is placed.    Length of Stay: 1  Current Medications: Scheduled Meds:   antiseptic oral rinse  15 mL Topical BID    Continuous  Infusions:  morphine 3 mg/hr (02/03/2021 1808)    PRN Meds: acetaminophen **OR** acetaminophen, diphenhydrAMINE, glycopyrrolate **OR** glycopyrrolate **OR** glycopyrrolate, haloperidol **OR** haloperidol **OR** haloperidol lactate, LORazepam **OR** LORazepam **OR** LORazepam, morphine, ondansetron **OR** ondansetron (ZOFRAN) IV, polyvinyl alcohol  Physical Exam Vitals and nursing note reviewed.  Constitutional:      General: She is not in acute distress.    Appearance: She is cachectic. She is ill-appearing.  Pulmonary:     Effort: No respiratory distress.     Comments: Slight increased work of breathing Skin:    General: Skin is cool and dry.  Neurological:     Mental Status: She is unresponsive.     Motor: Weakness present.  Psychiatric:        Speech: She is noncommunicative.            Vital Signs: BP (!) 72/56 (BP Location: Right Arm)   Pulse (!) 140   Temp 100.1 F (37.8 C) (Oral)   Resp (!) 31   Ht 5\' 8"  (1.727 m)   Wt 50.8 kg   SpO2 (!) 86%   BMI 17.03 kg/m  SpO2: SpO2: (!) 86 % O2 Device: O2 Device: Nasal Cannula O2 Flow Rate: O2 Flow Rate (L/min): 2 L/min  Intake/output summary:  Intake/Output Summary (Last 24 hours) at 02/12/2021 1037 Last data filed at 01/27/2021 1545 Gross per 24 hour  Intake 3716.5 ml  Output --  Net 3716.5 ml   LBM:   Baseline Weight: Weight: 50.8 kg Most recent weight: Weight: 50.8 kg       Palliative Assessment/Data: PPS 10%    Flowsheet Rows    Flowsheet Row Most Recent Value  Intake Tab   Referral Department Hospitalist  Unit at Time of Referral Med/Surg Unit  Palliative Care Primary Diagnosis Sepsis/Infectious Disease  Date Notified 01/26/2021  Palliative Care Type Return patient Palliative Care  Reason for referral Clarify Goals of Care, End of Life Care Assistance  Date of Admission 02/07/2021  Date first seen by Palliative Care 02/01/2021  # of days Palliative referral response time 0 Day(s)  # of days IP prior to  Palliative referral 0  Clinical Assessment   Psychosocial & Spiritual Assessment   Palliative Care Outcomes   Patient/Family meeting held? Yes  Who was at the meeting? daughter  Palliative Care Outcomes Improved pain interventions, Improved non-pain symptom therapy, Clarified goals of care, Counseled regarding hospice, Provided end of life care assistance, Provided psychosocial or spiritual support  Patient/Family wishes: Interventions discontinued/not started  Mechanical Ventilation, Antibiotics, Tube feedings/TPN, BiPAP, Hemodialysis, NIPPV, Trach, Transfusion, Vasopressors, PEG, Transfer out of ICU       Patient Active Problem List   Diagnosis Date Noted   Pressure injury of skin 01/23/2021   Admission for end of life care 01/28/2021   Severe sepsis with septic shock (HCC) 02/15/2021   Sepsis secondary to UTI (HCC) 02/05/2021   Protein-calorie malnutrition, severe 12/29/2020   Dementia (HCC)    UTI (urinary tract infection)    AKI (acute kidney injury) (HCC)    Macrocytic anemia    Essential hypertension 11/25/2020   Mixed hyperlipidemia 11/25/2020   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 Code Sepsis and pressors were initiated in ED. After discussion with family, patient was admitted on 01/20/2021 for comfort measures only.    Of note, patient was hospitalized from 8/11-8/16/22 for UTI. She was followed by PMT at that time and discharged to rehab with outpatient Palliative Care to follow.    Assessment: Terminal care Dementia Severe sepsis with septic shock  UTI  Recommendations/Plan: Continue full comfort measures Continue DNR/DNI as previously documented Patient is not stable for transfer - anticipate hospital death Remove oxygen - family agreeable Continue  current comfort focused medication regimen  PMT will continue to follow and support holistically  **ADDENDUM** Notified by primary RN that foley had been attempted x2 and " it appears that her urethra has partially ascended into her vagina." RN questioned if urology should be called to place coude catheter. Because patient is on comfort care and likely will pass soon, I do not feel urology consult for coude cath is necessary as this would cause the patient additional discomfort. RN does not feel in/out catheter would work either due to anatomy. RN reports patient appears comfortable and is not showing signs of discomfort due to urinary retention; also states family were ok not getting coude catheter. Will monitor symptoms, but would recommend comfort medications over coude catheter insertion. Bladder scan/foley order has been discontinued. Requested RN discuss updated plan with family.   Goals of Care and Additional Recommendations: Limitations on Scope of Treatment: Full Comfort Care  Code Status:    Code Status Orders  (From admission, onward)           Start     Ordered   01/21/2021 1648  Do not attempt resuscitation (DNR)  Continuous       Question Answer Comment  In the event of cardiac or respiratory ARREST Do not call a "code blue"   In the event of cardiac or respiratory ARREST Do not perform Intubation, CPR, defibrillation or ACLS   In the event of cardiac or respiratory ARREST Use medication by any route, position, wound care, and other measures to relive pain and suffering. May use oxygen, suction and manual treatment of airway obstruction as needed for comfort.      01/23/2021 1648            Code Status History     Date Active Date Inactive Code Status Order ID Comments User Context   02/10/2021 1450 02/14/2021 1648 DNR [id number]  Tobey Grim, NP ED   02/11/2021 1449 02/08/2021 1450 DNR [id number]  Tobey Grim, NP ED   12/27/2020 1549 01/02/2021 0230 DNR [id number]  Ollen Bowl, MD ED   12/27/2020 1510 12/27/2020 1549 Full Code [id number]  Ollen Bowl, MD ED      Advance Directive Documentation    Flowsheet Row Most Recent Value  Type of Advance Directive Out of facility DNR (pink MOST or yellow form)  Pre-existing out of facility DNR order (yellow form or pink MOST form) Pink MOST/Yellow Form most recent copy in chart - Physician notified to receive inpatient order  "MOST" Form in Place? --       Prognosis:  Hours - Days  Discharge Planning: Anticipated Hospital Death  Care plan was discussed with primary RN, patient's family  Thank you for allowing the Palliative Medicine Team to assist in the care of this patient.   Total Time 50 minutes Prolonged Time Billed  no       Greater than 50%  of this time was spent counseling and coordinating care related to the above assessment and plan.  Haskel Khan, NP  Please contact Palliative Medicine Team phone at [phone] for questions and concerns.

## 2021-02-16 NOTE — Progress Notes (Addendum)
Son arrived from [place] with his family around 52.  Increased morphine drip to 4 mg/hr.  Family bedside and as prepared as can be.  Pt breathing shallow, increased laboring.  Length of pauses are shorter than this morning, but breathing is faster.  No crackles in lung on auscultation.  Pt is comfortable and makes soft noises when family and RN speak to her. Eyes closed all shift.  Attempted FOLEY catheter insertion x2 by 2 different people when morning bladder scan showed 600+mL. Urethra partially ascended into vagina and unable to establish a FOLEY. Spoke with [employer] and family. Just want pt comfortable and not try another FOLEY.

## 2021-02-16 NOTE — Progress Notes (Signed)
Nutrition Brief Note  Chart reviewed. Patient triggered MST (Malnutrition Screening Tool).  Pt now transitioning to end of life care. Estimated patient has prognosis of approximately 24 hours.  No nutrition interventions planned at this time.   Please consult if needed.   Vertell Limber, RD, LDN (she/her/hers) Registered Dietitian I After-Hours/Weekend Pager # in [place]

## 2021-02-16 NOTE — Progress Notes (Signed)
PROGRESS NOTE   Chelsea Aguirre  QMG:[id number] DOB: Mar 26, 1951 DOA: 02/15/2021 PCP: Chelsea Bookman, NP  Brief Narrative:   70 year old lady [name] [recently moved from [place]], HTN HLD, CVA with dysphagia-recent hospitalization 8/11 through 8/16 E. coli UTI and DC to SNF Continued to do poorly with less interaction--- seen in the emergency room 9/1 diagnosis septic shock-Levophed started IV fluids antibiotics started CCM consulted patient requested full comfort care-admitted by [facility] for comfort measures  Hospital-Problem based course  Septic shock on admission-etiology of either aspiration versus full comfort-- discontinue all aggressive measures Utilize palliative care order set, allow unrestricted visitation only do periodic vital signs Continue morphine infusion Ativan as needed glycopyrrolate Expect demise over the next 72 hours and verbalizes to family Prior CVA with dysphagia Comfort feeds  DVT prophylaxis: none-comfort patient Code Status: DNR Family Communication: Discussed with patient's Sister and daughter at the bedside Disposition:  Status is: Inpatient  Remains inpatient appropriate because:Hemodynamically unstable and Inpatient level of care appropriate due to severity of illness  Dispo: The patient is from: Home              Anticipated d/c is to:  Anticipate hospital death              Patient currently is not medically stable to d/c.   Difficult to place patient No   Consultants:  Critical care Palliative care  Procedures:  Antimicrobials:     Subjective:  Nonresponsive breathing has become more shallow does not appear to be uncomfortable Note blood pressures are low although she still refusing  Objective: Vitals:   01/30/2021 1600 02/10/2021 1605 02/05/2021 1623 01/27/2021 0556  BP: (!) 75/57 (!) 71/58 (!) 60/47 (!) 72/56  Pulse: (!) 140  80 (!) 140  Resp: (!) 29 (!) 31    Temp:   97.9 F (36.6 C) 100.1 F (37.8 C)  TempSrc:   Axillary Oral   SpO2: 100%  100% (!) 86%  Weight:      Height:        Intake/Output Summary (Last 24 hours) at 02/07/2021 1151 Last data filed at 02/15/2021 1545 Gross per 24 hour  Intake 2092.5 ml  Output --  Net 2092.5 ml   Filed Weights   02/07/2021 0956  Weight: 50.8 kg    Examination:  Unresponsive white female pupils constricted Deep sighing respirations but appears comfortable no excessive secretions Extremities are warm S1-S2 slightly bradycardic Chest is clear no added sound no rales  Data Reviewed: personally reviewed   CBC    Component Value Date/Time   WBC 5.3 12/28/2020 0455   RBC 2.71 (L) 12/28/2020 0455   HGB 9.2 (L) 12/28/2020 0455   HCT 28.3 (L) 12/28/2020 0455   PLT 149 (L) 12/28/2020 0455   MCV 104.4 (H) 12/28/2020 0455   MCH 33.9 12/28/2020 0455   MCHC 32.5 12/28/2020 0455   RDW 14.0 12/28/2020 0455   LYMPHSABS 1,176 11/23/2020 0936   EOSABS 30 11/23/2020 0936   BASOSABS 18 11/23/2020 0936   CMP Latest Ref Rng & Units 12/28/2020 12/27/2020 12/27/2020  Glucose 70 - 99 mg/dL 94 - 326(Z)  BUN 8 - 23 mg/dL 19 - 23  Creatinine 1.24 - 1.00 mg/dL 5.80 9.98 3.38(S)  Sodium 135 - 145 mmol/L 140 - 143  Potassium 3.5 - 5.1 mmol/L 4.0 - 4.1  Chloride 98 - 111 mmol/L 105 - 102  CO2 22 - 32 mmol/L 27 - 31  Calcium 8.9 - 10.3 mg/dL 8.2(L) - 9.1  Total Protein 6.1 - 8.1 g/dL - - -  Total Bilirubin 0.2 - 1.2 mg/dL - - -  AST 10 - 35 U/L - - -  ALT 6 - 29 U/L - - -     Radiology Studies: DG Chest Port 1 View  Result Date: January 31, 2021 CLINICAL DATA:  UTI for 2 weeks, decreased physical ability, tachycardia, hypotension EXAM: PORTABLE CHEST 1 VIEW COMPARISON:  Chest radiograph 12/29/2020 FINDINGS: The cardiomediastinal silhouette is stable, with unchanged calcified atherosclerotic plaque of the aortic arch. There is no focal consolidation or pulmonary edema. There is no pleural effusion or pneumothorax. Dextrocurvature of the spine is unchanged. There is no acute osseous  abnormality. IMPRESSION: Stable chest with no radiographic evidence of acute cardiopulmonary process. Electronically Signed   By: Lesia Hausen M.D.   On: 01/31/21 11:15   CT CHEST ABDOMEN PELVIS WO CONTRAST  Result Date: Jan 31, 2021 CLINICAL DATA:  Pneumonia, tachycardia. EXAM: CT CHEST, ABDOMEN AND PELVIS WITHOUT CONTRAST TECHNIQUE: Multidetector CT imaging of the chest, abdomen and pelvis was performed following the standard protocol without IV contrast. COMPARISON:  None. FINDINGS: CT CHEST FINDINGS Cardiovascular: Atherosclerosis of thoracic aorta is noted. 4 cm ascending thoracic aortic aneurysm is noted. Normal cardiac size. No pericardial effusion. Mild coronary artery calcifications are noted. Mediastinum/Nodes: No enlarged mediastinal, hilar, or axillary lymph nodes. Thyroid gland, trachea, and esophagus demonstrate no significant findings. Lungs/Pleura: No pneumothorax or pleural effusion is noted. Emphysematous disease is noted in the upper lobes bilaterally. Mild biapical scarring is noted. Right middle lobe airspace opacity is noted most consistent with pneumonia. Musculoskeletal: No chest wall mass or suspicious bone lesions identified. CT ABDOMEN PELVIS FINDINGS Hepatobiliary: No focal liver abnormality is seen. No gallstones, gallbladder wall thickening, or biliary dilatation. Pancreas: Unremarkable. No pancreatic ductal dilatation or surrounding inflammatory changes. Spleen: Normal in size without focal abnormality. Adrenals/Urinary Tract: Adrenal glands are unremarkable. Kidneys are normal, without renal calculi, focal lesion, or hydronephrosis. Bladder is unremarkable. Stomach/Bowel: The stomach appears normal. There is no evidence of bowel obstruction or inflammation. Stool is noted throughout the colon. The appendix is not clearly visualized. Vascular/Lymphatic: Aortic atherosclerosis. No enlarged abdominal or pelvic lymph nodes. Reproductive: Status post hysterectomy. No adnexal masses.  Other: No abdominal wall hernia or abnormality. No abdominopelvic ascites. Musculoskeletal: No acute or significant osseous findings. IMPRESSION: 4 cm ascending thoracic aortic aneurysm. Recommend annual imaging followup by CTA or MRA. This recommendation follows 2010 ACCF/AHA/AATS/ACR/ASA/SCA/SCAI/SIR/STS/SVM Guidelines for the Diagnosis and Management of Patients with Thoracic Aortic Disease. Circulation. 2010; 121: Z610-R604. Aortic aneurysm NOS (ICD10-I71.9). Right middle lobe airspace opacity is noted consistent with pneumonia. No acute abnormality seen in the abdomen or pelvis. Aortic Atherosclerosis (ICD10-I70.0) and Emphysema (ICD10-J43.9). Electronically Signed   By: Lupita Raider M.D.   On: 01-31-2021 13:43     Scheduled Meds:  antiseptic oral rinse  15 mL Topical BID   Continuous Infusions:  morphine 3 mg/hr (2021-01-31 1808)     LOS: 1 day   Time spent: 22  Rhetta Mura, MD [facility] To contact the attending provider between 7A-7P or the covering provider during after hours 7P-7A, please log into the web site [email or web address] and access using universal [place] password for that web site. If you do not have the password, please call the hospital operator.  02/06/2021, 11:51 AM

## 2021-02-16 NOTE — Hospital Course (Addendum)
70 [facility] [recently moved from [place]], HTN HLD, CVA with dysphagia-recent hospitalization 8/11 through 8/16 E. coli UTI and DC to SNF Continued to do poorly with less interaction--- seen in the emergency room 9/1 diagnosis septic shock-Levophed started IV fluids antibiotics started CCM consulted patient requested full comfort care-admitted by [facility] for comfort measures

## 2021-02-16 NOTE — Death Summary Note (Signed)
Death Summary  Nashaly Dorantes KGY:[id number] DOB: 12/01/1950 DOA: Feb 16, 2021  PCP: Caesar Bookman, NP PCP/Office notified: Epic notification   Admit date: 02/16/2021 Date of Death: 2021/02/19  Final Diagnoses:  Principal Problem:   Admission for end of life care Active Problems:   Dementia (HCC)   Severe sepsis with septic shock (HCC)   Sepsis secondary to UTI (HCC)    Hypoxic respiratory failure secondary to advanced sepsis Severe sepsis on admission probably aspiration versus urinary infection  History of present illness:   70 [facility] old [name] [recently moved from [place]], HTN HLD, CVA with dysphagia-recent hospitalization 8/11 through 8/16 E. coli UTI and DC to SNF Continued to do poorly with less interaction--- seen in the emergency room 02/17/23 diagnosis septic shock-Levophed started IV fluids antibiotics started CCM consulted patient requested full comfort care-admitted by [facility] for comfort measures  Hospital Course:  Patient was seen by palliative care and they discussed in detail with the family goals of care and ultimately was elected to utilize palliative care measures in addition to morphine drip and Ativan It was not felt that patient would survive hospitalization given her lethargy hypotension etc. Patient was kept on full comfort measures and was declared to be not bleeding pulseless and this was verified by 2 nurses Time of death was 2248 as per nursing note Death certificate was performed   Time: 15 minutes  Signed:  Rhetta Mura  [facility] Feb 19, 2021, 4:37 PM

## 2021-02-16 DEATH — deceased

## 2021-04-25 ENCOUNTER — Ambulatory Visit: Payer: PPO | Admitting: Family

## 2023-01-30 IMAGING — CT CT HEAD W/O CM
1 series · 16 of 29 positions shown, 20 images · non-contrast
Comparison: None.

CLINICAL DATA: Delirium

EXAM:
CT HEAD WITHOUT CONTRAST
TECHNIQUE: Contiguous axial images were obtained from the base of the skull
through the vertex without intravenous contrast.

[Series 3: head wo · axial · 0.47mm/px · z∈[-120,+10]mm · 16 of 29 slices shown, 20 images]
[im 2/29  brain]
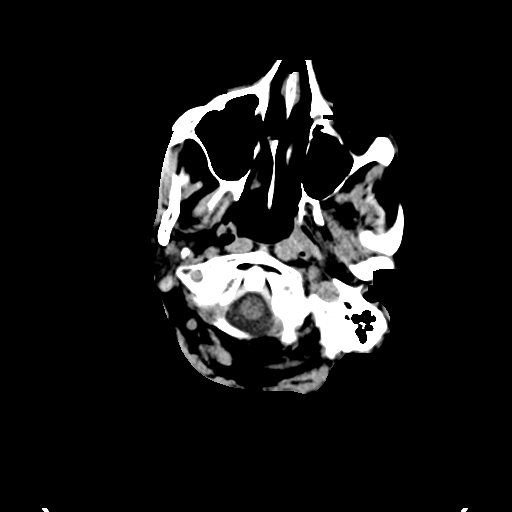
[im 2/29  bone]
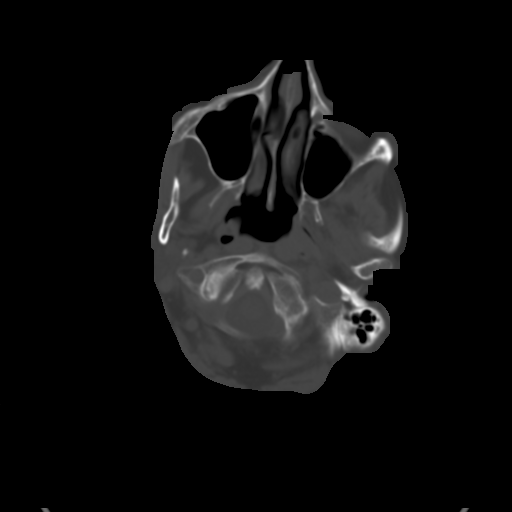
[im 4/29  brain]
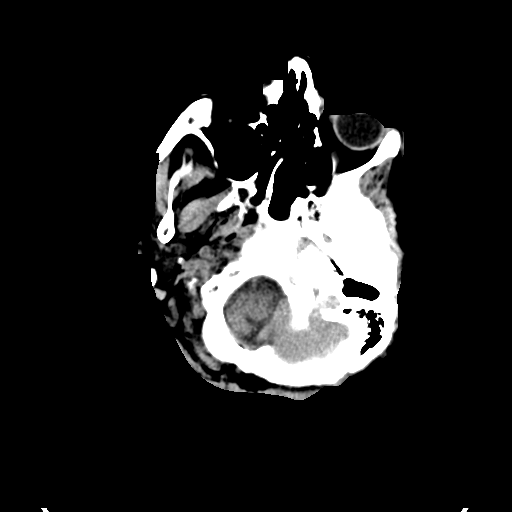
[im 6/29  brain]
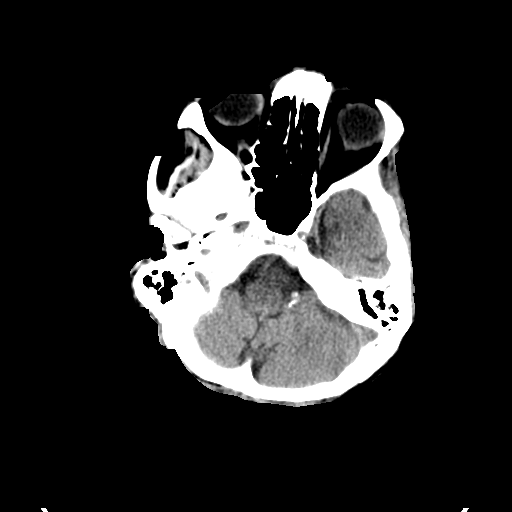
[im 7/29  brain]
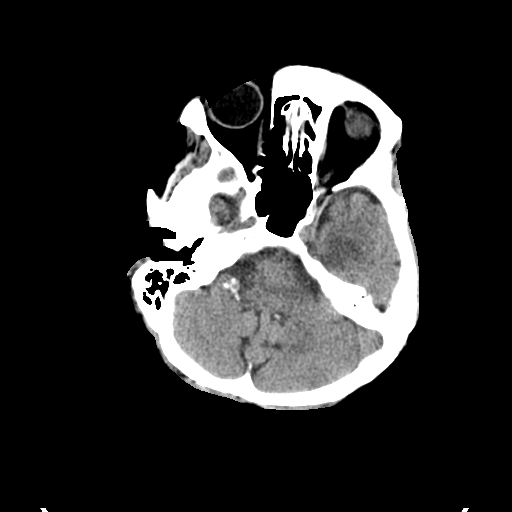
[im 9/29  brain]
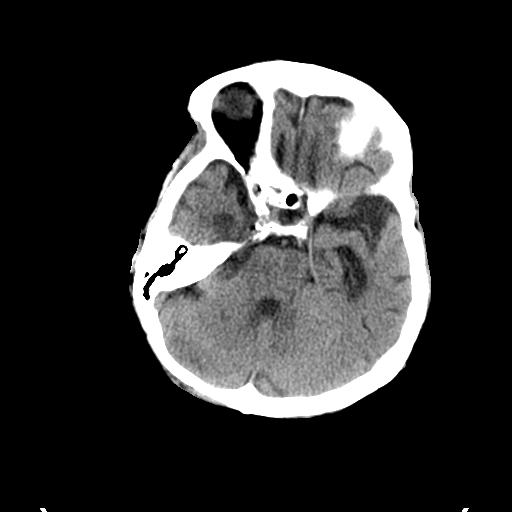
[im 9/29  bone]
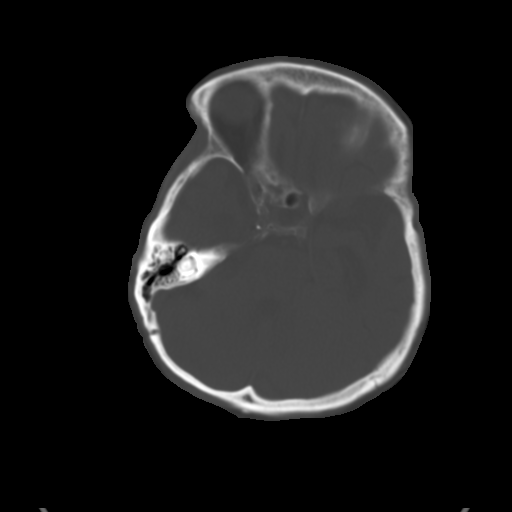
[im 11/29  brain]
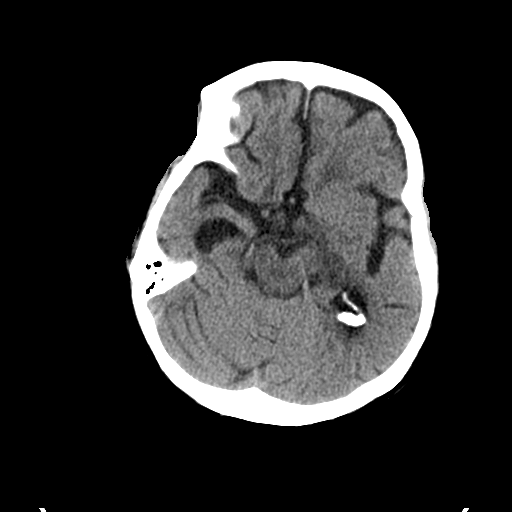
[im 12/29  brain]
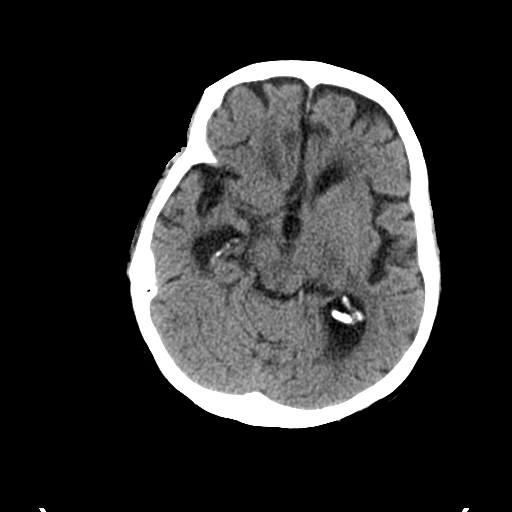
[im 14/29  brain]
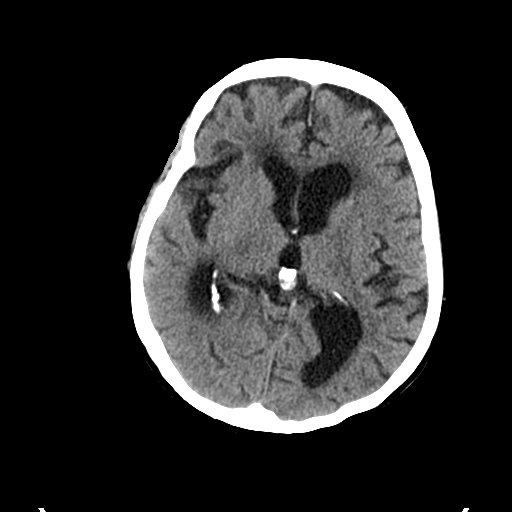
[im 16/29  brain]
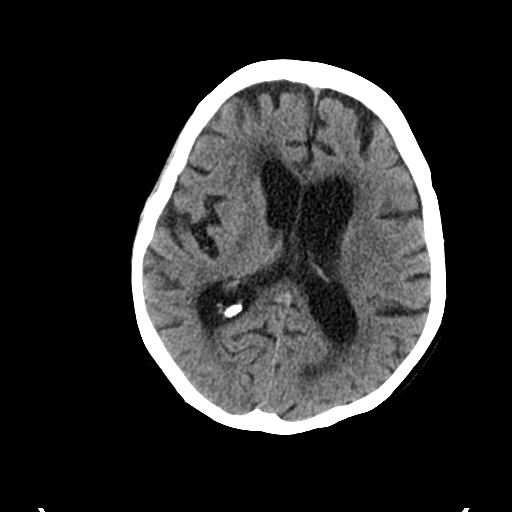
[im 16/29  bone]
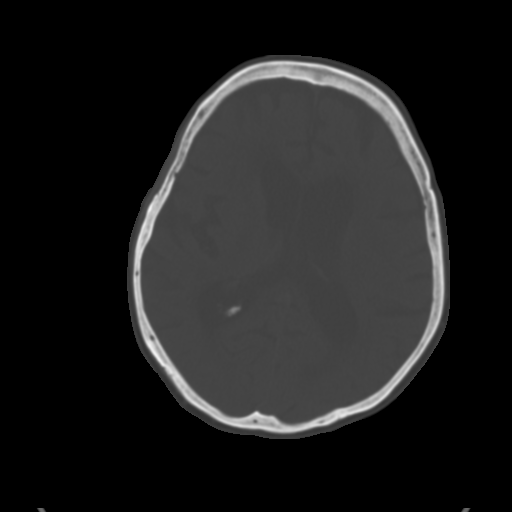
[im 18/29  brain]
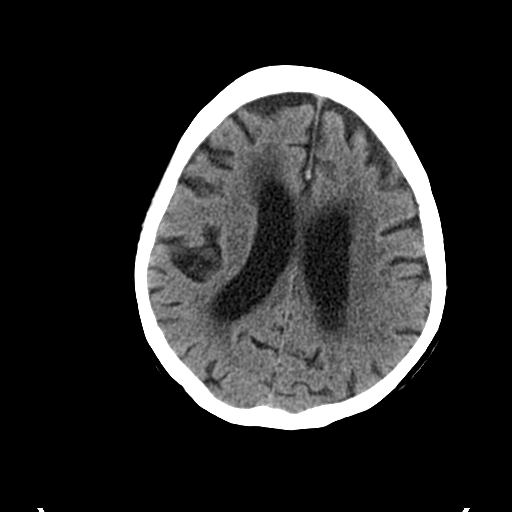
[im 19/29  brain]
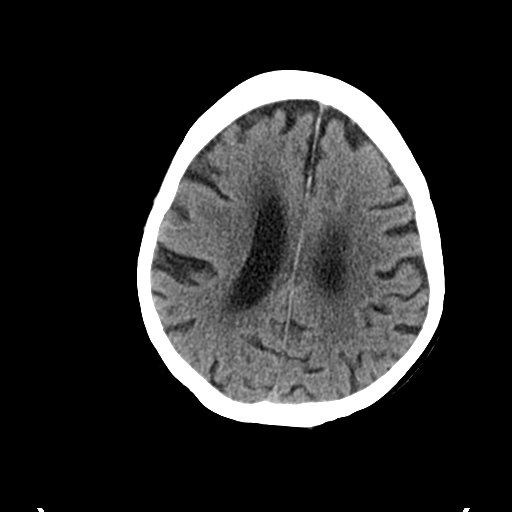
[im 21/29  brain]
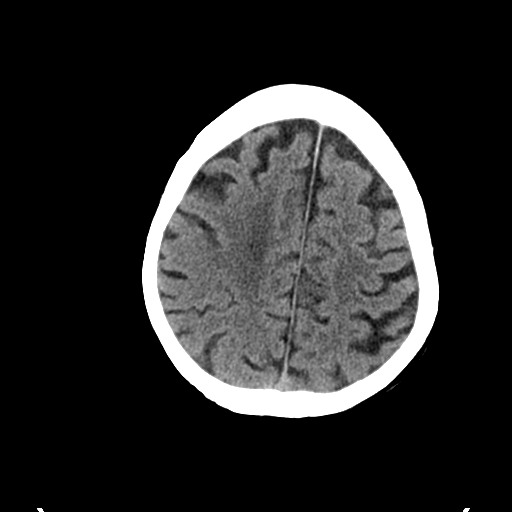
[im 23/29  brain]
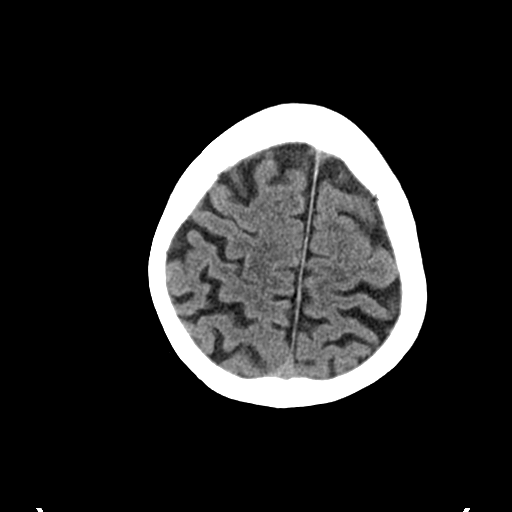
[im 23/29  bone]
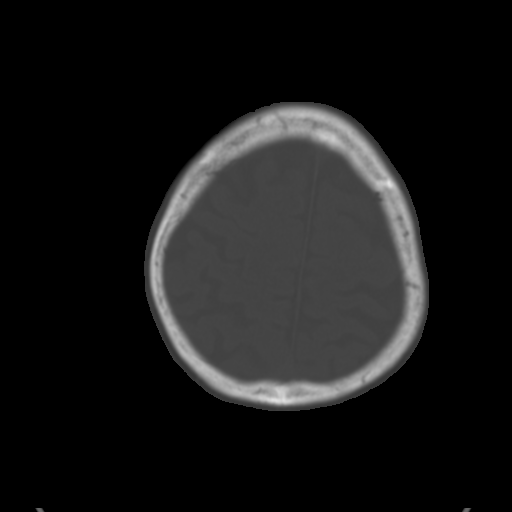
[im 24/29  brain]
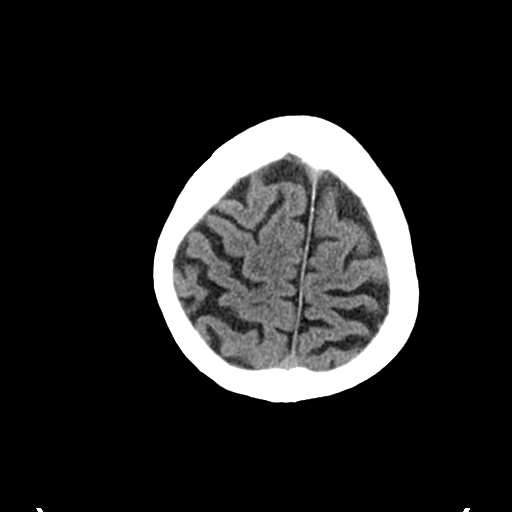
[im 26/29  brain]
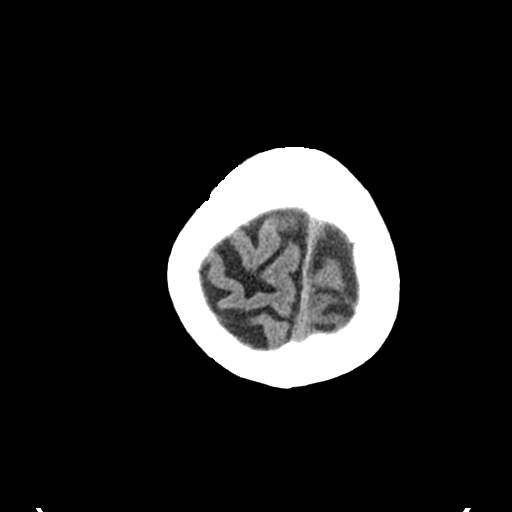
[im 28/29  brain]
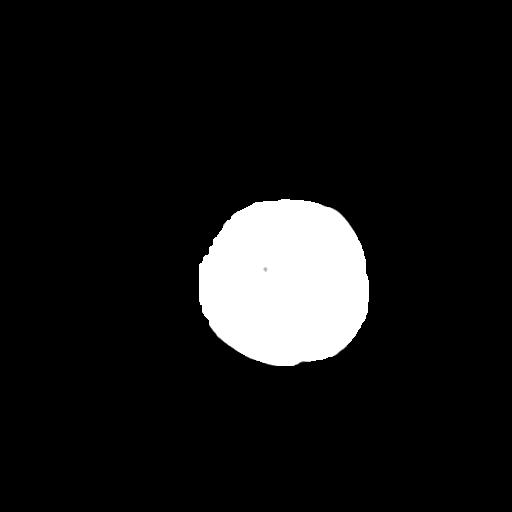

[16 of 29 positions shown; findings below may reference images not displayed]

FINDINGS: Brain: No evidence of acute infarction, hemorrhage, hydrocephalus,
extra-axial collection or mass lesion/mass effect. Mild low-density
changes within the periventricular and subcortical white matter
compatible with chronic microvascular ischemic change. Mild diffuse
cerebral volume loss.

Vascular: Atherosclerotic calcifications involving the large vessels
of the skull base. No unexpected hyperdense vessel.

Skull: Normal. Negative for fracture or focal lesion.

Sinuses/Orbits: No acute finding.

Other: None.
IMPRESSION: 1. No acute intracranial findings.
2. Chronic microvascular ischemic change and cerebral volume loss.
